# Patient Record
Sex: Female | Born: 1970
Health system: Southern US, Community
[De-identification: ages and names within clinical notes are randomized; demographics above are authoritative.]

## PROBLEM LIST (undated history)

## (undated) DIAGNOSIS — E785 Hyperlipidemia, unspecified: Secondary | ICD-10-CM

## (undated) DIAGNOSIS — E042 Nontoxic multinodular goiter: Secondary | ICD-10-CM

## (undated) DIAGNOSIS — D649 Anemia, unspecified: Secondary | ICD-10-CM

## (undated) DIAGNOSIS — E119 Type 2 diabetes mellitus without complications: Secondary | ICD-10-CM

## (undated) DIAGNOSIS — E669 Obesity, unspecified: Secondary | ICD-10-CM

## (undated) HISTORY — DX: Obesity, unspecified: E66.9

## (undated) HISTORY — DX: Type 2 diabetes mellitus without complications: E11.9

## (undated) HISTORY — PX: ABDOMINAL HYSTERECTOMY: SHX81

## (undated) HISTORY — PX: LEEP: SHX91

## (undated) HISTORY — DX: Hyperlipidemia, unspecified: E78.5

## (undated) HISTORY — DX: Anemia, unspecified: D64.9

---

## 1998-10-04 ENCOUNTER — Other Ambulatory Visit: Admission: RE | Admit: 1998-10-04 | Discharge: 1998-10-04 | Payer: Self-pay

## 1999-02-19 ENCOUNTER — Emergency Department (HOSPITAL_COMMUNITY): Admission: EM | Admit: 1999-02-19 | Discharge: 1999-02-19 | Payer: Self-pay | Admitting: Emergency Medicine

## 2000-03-05 ENCOUNTER — Other Ambulatory Visit: Admission: RE | Admit: 2000-03-05 | Discharge: 2000-03-05 | Payer: Self-pay | Admitting: Obstetrics and Gynecology

## 2001-01-31 ENCOUNTER — Other Ambulatory Visit: Admission: RE | Admit: 2001-01-31 | Discharge: 2001-01-31 | Payer: Self-pay | Admitting: Obstetrics and Gynecology

## 2002-02-12 ENCOUNTER — Encounter (INDEPENDENT_AMBULATORY_CARE_PROVIDER_SITE_OTHER): Payer: Self-pay

## 2002-02-12 ENCOUNTER — Ambulatory Visit (HOSPITAL_COMMUNITY): Admission: RE | Admit: 2002-02-12 | Discharge: 2002-02-12 | Payer: Self-pay | Admitting: Obstetrics and Gynecology

## 2002-08-13 ENCOUNTER — Other Ambulatory Visit: Admission: RE | Admit: 2002-08-13 | Discharge: 2002-08-13 | Payer: Self-pay | Admitting: Obstetrics and Gynecology

## 2004-06-13 ENCOUNTER — Ambulatory Visit (HOSPITAL_COMMUNITY): Admission: RE | Admit: 2004-06-13 | Discharge: 2004-06-13 | Payer: Self-pay | Admitting: Obstetrics and Gynecology

## 2004-12-21 ENCOUNTER — Other Ambulatory Visit: Admission: RE | Admit: 2004-12-21 | Discharge: 2004-12-21 | Payer: Self-pay | Admitting: Obstetrics and Gynecology

## 2005-10-12 ENCOUNTER — Other Ambulatory Visit: Admission: RE | Admit: 2005-10-12 | Discharge: 2005-10-12 | Payer: Self-pay | Admitting: Obstetrics and Gynecology

## 2005-10-17 ENCOUNTER — Encounter: Admission: RE | Admit: 2005-10-17 | Discharge: 2005-10-17 | Payer: Self-pay | Admitting: Obstetrics and Gynecology

## 2005-12-31 ENCOUNTER — Ambulatory Visit (HOSPITAL_COMMUNITY): Admission: RE | Admit: 2005-12-31 | Discharge: 2005-12-31 | Payer: Self-pay | Admitting: Obstetrics and Gynecology

## 2005-12-31 ENCOUNTER — Encounter (INDEPENDENT_AMBULATORY_CARE_PROVIDER_SITE_OTHER): Payer: Self-pay | Admitting: *Deleted

## 2006-08-22 ENCOUNTER — Ambulatory Visit (HOSPITAL_COMMUNITY): Admission: RE | Admit: 2006-08-22 | Discharge: 2006-08-22 | Payer: Self-pay | Admitting: Obstetrics and Gynecology

## 2007-10-16 ENCOUNTER — Ambulatory Visit (HOSPITAL_COMMUNITY): Admission: RE | Admit: 2007-10-16 | Discharge: 2007-10-16 | Payer: Self-pay | Admitting: Obstetrics and Gynecology

## 2007-10-27 ENCOUNTER — Encounter: Admission: RE | Admit: 2007-10-27 | Discharge: 2007-10-27 | Payer: Self-pay | Admitting: Obstetrics and Gynecology

## 2008-04-13 ENCOUNTER — Encounter: Admission: RE | Admit: 2008-04-13 | Discharge: 2008-04-13 | Payer: Self-pay | Admitting: Internal Medicine

## 2008-05-18 ENCOUNTER — Encounter: Admission: RE | Admit: 2008-05-18 | Discharge: 2008-05-18 | Payer: Self-pay | Admitting: Internal Medicine

## 2008-07-31 ENCOUNTER — Emergency Department (HOSPITAL_COMMUNITY): Admission: EM | Admit: 2008-07-31 | Discharge: 2008-07-31 | Payer: Self-pay | Admitting: Emergency Medicine

## 2008-10-29 ENCOUNTER — Encounter: Admission: RE | Admit: 2008-10-29 | Discharge: 2008-10-29 | Payer: Self-pay | Admitting: Internal Medicine

## 2009-03-14 ENCOUNTER — Encounter (INDEPENDENT_AMBULATORY_CARE_PROVIDER_SITE_OTHER): Payer: Self-pay | Admitting: Obstetrics and Gynecology

## 2009-03-14 ENCOUNTER — Ambulatory Visit (HOSPITAL_COMMUNITY): Admission: RE | Admit: 2009-03-14 | Discharge: 2009-03-15 | Payer: Self-pay | Admitting: Obstetrics and Gynecology

## 2009-12-01 ENCOUNTER — Encounter: Admission: RE | Admit: 2009-12-01 | Discharge: 2009-12-01 | Payer: Self-pay | Admitting: Internal Medicine

## 2010-04-30 ENCOUNTER — Encounter: Payer: Self-pay | Admitting: Internal Medicine

## 2010-07-11 LAB — CBC
HCT: 37.6 % (ref 36.0–46.0)
Hemoglobin: 10.8 g/dL — ABNORMAL LOW (ref 12.0–15.0)
MCHC: 33 g/dL (ref 30.0–36.0)
MCV: 87.6 fL (ref 78.0–100.0)
Platelets: 335 10*3/uL (ref 150–400)
RBC: 3.69 MIL/uL — ABNORMAL LOW (ref 3.87–5.11)
RDW: 13.8 % (ref 11.5–15.5)

## 2010-07-11 LAB — BASIC METABOLIC PANEL
CO2: 28 mEq/L (ref 19–32)
Calcium: 8.5 mg/dL (ref 8.4–10.5)
Chloride: 102 mEq/L (ref 96–112)
Creatinine, Ser: 0.73 mg/dL (ref 0.4–1.2)
GFR calc Af Amer: >60 mL/min (ref >60)
Sodium: 134 mEq/L — ABNORMAL LOW (ref 135–145)

## 2010-07-11 LAB — PREGNANCY, URINE: Preg Test, Ur: NEGATIVE

## 2010-08-25 NOTE — H&P (Signed)
   NAME:  Ellen Mills, Ellen Mills                          ACCOUNT NO.:  [id number]   MEDICAL RECORD NO.:  [id number]                   PATIENT TYPE:  AMB   LOCATION:  [facility]                                  FACILITY:  [facility]   PHYSICIAN:  Janine Limbo, M.D.            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  The patient has  been seen at [facility], where she underwent  colposcopically directed biopsies on December 24, 2001, because of a Pap  smear that showed atypical squamous cells of uncertain significance.  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   DRUG ALLERGIES:  None known.   OBSTETRICAL HISTORY:  The patient has had three term vaginal deliveries.  She also had a tubal ligation in the past.   PAST MEDICAL HISTORY:  The patient had a tubal ligation in 1992 as mentioned  above.  Generally speaking she is very healthy.   SOCIAL HISTORY:  The patient denies cigarette use, alcohol use, and  recreational drug use.   REVIEW OF SYSTEMS:  Noncontributory.   FAMILY HISTORY:  The patient's father had diabetes.   PHYSICAL EXAMINATION:  VITAL SIGNS:  Weight is 203 pounds.  HEENT:  Within normal limits.  CHEST:  Clear.  HEART:  Regular rate and rhythm.  BREASTS:  Without masses.  ABDOMEN:  Nontender.  EXTREMITIES:  Within normal limits.  NEUROLOGIC:  Grossly normal.  PELVIC:  External genitalia is normal.  Vagina is normal.  Cervix is normal  except for colposcopic findings.  Uterus is normal size, shape, and  consistency, and adnexa are without masses.    ASSESSMENT:  1. 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  2.  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   PLAN:  The patient will undergo a loop electrosurgical excision procedure.  Risks and benefits were reviewed.                                                  Janine Limbo, M.D.    AVS/MEDQ  D:  02/11/2002  T:  02/11/2002  Job:  [id number]

## 2010-08-25 NOTE — Op Note (Signed)
NAMEEMMABELLE, Ellen Mills                ACCOUNT NO.:  [id number]   MEDICAL RECORD NO.:  [id number]          PATIENT TYPE:  AMB   LOCATION:  SDC                           [facility]ILITY:  [facility]   PHYSICIAN:  Cynthia P. Romine, M.D.DATE OF BIRTH:  [birth year]   DATE OF PROCEDURE:  12/31/2005  DATE OF DISCHARGE:                                 OPERATIVE REPORT   PREOPERATIVE DIAGNOSES:  1. Menorrhagia.  2. Endometrial polyp.   POSTOPERATIVE DIAGNOSES:  Submucous myoma, pathology pending.   OPERATION/PROCEDURE:  Hysteroscopic resection of submucous myoma,   SURGEON:  Cynthia P. Romine, M.D.   ANESTHESIA:  General by LMA.   ESTIMATED BLOOD LOSS:  Minimal.   SORBITOL DEFICIT:  25 mL.   COMPLICATIONS:  None.   DESCRIPTION OF PROCEDURE:  The patient was taken to the operating room and  after the induction of adequate general anesthesia by LMA, was placed in the  dorsal supine position and prepped and draped in the usual fashion.  The  bladder was drained with the red rubber catheter.  A posterior weighted  __________  retractor placed.  The cervix was grasped on its anterior lip  with the single-tooth tenaculum.  Cervix was dilated to a #25 Shawnie Pons.  At  that point,  __________  was inserted with dilator at the single-tooth  tenaculum  fell of the anterior lip.  Therefore, it was replaced and a  second tenaculum was put on the posterior lip and the dilation continued to  a #31 Pratt.  Prior to dilation, the uterus was then sounded to 8 cm.  The  operative hysteroscope was introduced.  Sorbitol was used as a distention  medium.  The endometrial lesion was visualized and occupied most of the  endometrial cavity.  On sonohystogram, it had measured 2 x 1.8 x 1.1 cm and  appearance hysteroscopically was consistent with that measurement.  The  single loupe was used to resect the myoma in pieces. There was very little  bleeding from the myoma. The patient had been given preoperative Lupron on  November 26, 2005.  Following completion of the removal of the myoma, several  thin strips of endometrium were also removed in an effort to curb her  menstrual bleeding postoperatively.  Specimens were sent for pathology.  Photographs for documentation were taken after removed of the polyp.  The  scope was removed.  Instruments removed from the vagina.  Procedure was  terminated.  The patient tolerated it well and was in satisfactory condition  to post anesthesia recovery.          ______________________________  Edwena Felty. Romine, M.D.    CPR/MEDQ  D:  12/31/2005  T:  01/01/2006  Job:  [id number]

## 2010-08-25 NOTE — Op Note (Signed)
NAME:  Ellen Mills, Ellen Mills                          ACCOUNT NO.:  [id number]   MEDICAL RECORD NO.:  [id number]                   PATIENT TYPE:  AMB   LOCATION:  SDC                                  [facility]:  [facility]   PHYSICIAN:  Janine Limbo, M.D.            DATE OF BIRTH:  July 02, 1970   DATE OF PROCEDURE:  02/12/2002  DATE OF DISCHARGE:                                 OPERATIVE REPORT   PREOPERATIVE DIAGNOSES:  1. 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  2. 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   POSTOPERATIVE DIAGNOSES:  1. 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  2. 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   PROCEDURE:  Loop electrosurgical excision procedure.   SURGEON:  Janine Limbo, M.D.   ANESTHESIA:  Local.   DISPOSITION:  The patient is a 40 year old female para 3-0-0-3 who presents  with a Pap smear that showed atypical squamous cells of undetermined  significance.  Colposcopically directed biopsy showed a high grade squamous  intraepithelial lesion.  The patient understands the indications for her  procedure and she accepts the associated risks.   FINDINGS:  There was a small area of nonstaining around the endocervical os.   PROCEDURE:  The patient was taken to the operating room where she was placed  in a lithotomy position.  The vagina was prepped with multiple layers of  Betadine.  The cervix was injected with 30 cc of a diluted solution of 0.5%  Marcaine, Pitressin, and saline.  Lugol solution was placed on the cervix  and a small area of nonstaining cells were noted around the os.  The loop of  the electrosurgical machine was then used to outline the lesion and then a  cone shaped specimen was obtained.  Hemostasis was adequate.  The base of  the defect was then cauterized using the ball apparatus as was the periphery  of the conization site.  The patient tolerated her procedure well.  The  estimated blood loss was 2 cc.  The  patient was returned to the supine  position and then taken to the recovery room in stable condition.   FOLLOWUP INSTRUCTIONS:  The patient will use ibuprofen 600 mg q.6h. as  needed for pain.  She will return in two or three weeks to see Dr. Stefano Gaul.  She was given a copy of the postoperative instruction sheet as prepared by  the [facility] for patients who have undergone a  dilatation and curettage, but that was then modified for a LEEP procedure.                                               Janine Limbo, M.D.    AVS/MEDQ  D:  02/12/2002  T:  02/12/2002  Job:  [id number]

## 2010-10-24 ENCOUNTER — Other Ambulatory Visit: Payer: Self-pay | Admitting: Internal Medicine

## 2010-10-24 DIAGNOSIS — Z1231 Encounter for screening mammogram for malignant neoplasm of breast: Secondary | ICD-10-CM

## 2010-12-04 ENCOUNTER — Ambulatory Visit: Payer: Self-pay

## 2011-01-03 ENCOUNTER — Ambulatory Visit
Admission: RE | Admit: 2011-01-03 | Discharge: 2011-01-03 | Disposition: A | Discharge status: Home / Self Care | Payer: 59 | Source: Ambulatory Visit | Attending: Internal Medicine | Admitting: Internal Medicine

## 2011-01-03 DIAGNOSIS — Z1231 Encounter for screening mammogram for malignant neoplasm of breast: Secondary | ICD-10-CM

## 2011-12-07 ENCOUNTER — Other Ambulatory Visit: Payer: Self-pay | Admitting: Obstetrics and Gynecology

## 2011-12-07 DIAGNOSIS — Z1231 Encounter for screening mammogram for malignant neoplasm of breast: Secondary | ICD-10-CM

## 2012-01-04 ENCOUNTER — Ambulatory Visit
Admission: RE | Admit: 2012-01-04 | Discharge: 2012-01-04 | Disposition: A | Discharge status: Home / Self Care | Payer: 59 | Source: Ambulatory Visit | Attending: Obstetrics and Gynecology | Admitting: Obstetrics and Gynecology

## 2012-01-04 DIAGNOSIS — Z1231 Encounter for screening mammogram for malignant neoplasm of breast: Secondary | ICD-10-CM

## 2012-10-20 ENCOUNTER — Other Ambulatory Visit: Payer: Self-pay

## 2012-10-20 DIAGNOSIS — Z1231 Encounter for screening mammogram for malignant neoplasm of breast: Secondary | ICD-10-CM

## 2013-01-30 ENCOUNTER — Ambulatory Visit
Admission: RE | Admit: 2013-01-30 | Discharge: 2013-01-30 | Disposition: A | Discharge status: Home / Self Care | Payer: 59 | Source: Ambulatory Visit

## 2013-01-30 DIAGNOSIS — Z1231 Encounter for screening mammogram for malignant neoplasm of breast: Secondary | ICD-10-CM

## 2013-03-14 ENCOUNTER — Encounter: Payer: 59 | Attending: Obstetrics and Gynecology

## 2013-03-14 NOTE — Progress Notes (Deleted)
Patient was seen on 03/14/13 for the complete diabetes self-management series at [facility].  Current A1c = *** on ***  Handouts given during class include:  Living Well with Diabetes book  Carb Counting and Meal Planning book  Meal Plan Card  Carbohydrate guide  Meal planning worksheet  Low Sodium Flavoring Tips  The diabetes portion plate  Low Carbohydrate Snack Suggestions  A1c to eAG Conversion Chart  Diabetes Medications  Stress Management  Diabetes Recommended Care Schedule  Diabetes Success Plan  Core Class Satisfaction Survey  The following learning objectives were met by the patient during this course:  Describe diabetes  State some common risk factors for diabetes  Defines the role of glucose and insulin  Identifies type of diabetes and pathophysiology  Describe the relationship between diabetes and cardiovascular risk  State the members of the Healthcare Team  States the rationale for glucose monitoring  State when to test glucose  State their individual Target Range  State the importance of logging glucose readings  Describe how to interpret glucose readings  Identifies A1C target  Explain the correlation between A1c and eAG values  State symptoms and treatment of high blood glucose  State symptoms and treatment of low blood glucose  Explain proper technique for glucose testing  Identifies proper sharps disposal  Describe the role of different macronutrients on glucose  Explain how carbohydrates affect blood glucose  State what foods contain the most carbohydrates  Demonstrate carbohydrate counting  Demonstrate how to read Nutrition Facts food label  Describe effects of various fats on heart health  Describe the importance of good nutrition for health and healthy eating strategies  Describe techniques for managing your shopping, cooking and meal planning  List strategies to follow meal  plan when dining out  Describe the effects of alcohol on glucose and how to use it safely   State the amount of activity recommended for healthy living   Describe activities suitable for individual needs   Identify ways to regularly incorporate activity into daily life   Identify barriers to activity and ways to over come these barriers  Identify diabetes medications being personally used and their primary action for lowering glucose and possible side effects   Describe role of stress on blood glucose and develop strategies to address psychosocial issues   Identify diabetes complications and ways to prevent them  Explain how to manage diabetes during illness   Evaluate success in meeting personal goal   Establish 2-3 goals that they will plan to diligently work on until they return for the  45-month follow-up visit  Goals:  Follow Diabetes Meal Plan as instructed  Eat 3 meals and 2 snacks, every 3-5 hrs  Limit carbohydrate intake to 45 grams carbohydrate/meal Limit carbohydrate intake to 15 grams carbohydrate/snack Add lean protein foods to meals/snacks  Monitor glucose levels as instructed by your doctor  Aim for 15-30 mins of physical activity daily as tolerated  Bring food record and glucose log to your next nutrition visit  Your patient has established the following 4 month goals in their individualized success plan:  ***  ***  ***  Your patient has identified these potential barriers to change:  ***  ***  ***  Your patient has identified their diabetes self-care support plan as  ***  ***  ***

## 2014-01-27 ENCOUNTER — Other Ambulatory Visit: Payer: Self-pay

## 2014-01-27 DIAGNOSIS — Z1239 Encounter for other screening for malignant neoplasm of breast: Secondary | ICD-10-CM

## 2014-03-09 ENCOUNTER — Ambulatory Visit: Payer: 59

## 2014-09-13 ENCOUNTER — Other Ambulatory Visit: Payer: Self-pay

## 2014-09-13 DIAGNOSIS — Z1231 Encounter for screening mammogram for malignant neoplasm of breast: Secondary | ICD-10-CM

## 2014-09-17 ENCOUNTER — Ambulatory Visit
Admission: RE | Admit: 2014-09-17 | Discharge: 2014-09-17 | Disposition: A | Discharge status: Home / Self Care | Payer: 59 | Source: Ambulatory Visit

## 2014-09-17 DIAGNOSIS — Z1231 Encounter for screening mammogram for malignant neoplasm of breast: Secondary | ICD-10-CM

## 2015-06-07 DIAGNOSIS — H52203 Unspecified astigmatism, bilateral: Secondary | ICD-10-CM | POA: Diagnosis not present

## 2015-06-07 DIAGNOSIS — H5203 Hypermetropia, bilateral: Secondary | ICD-10-CM | POA: Diagnosis not present

## 2015-06-07 DIAGNOSIS — H524 Presbyopia: Secondary | ICD-10-CM | POA: Diagnosis not present

## 2016-02-06 ENCOUNTER — Encounter (HOSPITAL_COMMUNITY): Payer: Self-pay | Admitting: Emergency Medicine

## 2016-02-06 ENCOUNTER — Ambulatory Visit (INDEPENDENT_AMBULATORY_CARE_PROVIDER_SITE_OTHER): Payer: 59

## 2016-02-06 ENCOUNTER — Ambulatory Visit (HOSPITAL_COMMUNITY)
Admission: EM | Admit: 2016-02-06 | Discharge: 2016-02-06 | Disposition: A | Discharge status: Home / Self Care | Payer: 59 | Attending: Emergency Medicine | Admitting: Emergency Medicine

## 2016-02-06 DIAGNOSIS — M1711 Unilateral primary osteoarthritis, right knee: Secondary | ICD-10-CM | POA: Diagnosis not present

## 2016-02-06 DIAGNOSIS — M25561 Pain in right knee: Secondary | ICD-10-CM | POA: Diagnosis not present

## 2016-02-06 DIAGNOSIS — M7989 Other specified soft tissue disorders: Secondary | ICD-10-CM | POA: Diagnosis not present

## 2016-02-06 MED ORDER — INDOMETHACIN ER 75 MG PO CPCR: 75.0000 mg | ORAL_CAPSULE | Freq: Two times a day (BID) | ORAL | 1 refills | Status: DC

## 2016-02-06 MED FILL — INDOMETHACIN ER 75 MG CAP: 75 | 15 days supply | Qty: 30 | Fill #0

## 2016-02-06 NOTE — ED Provider Notes (Signed)
CSN: [id number]     Arrival date & time 02/06/16  1542 History   None    Chief Complaint  Patient presents with  . Knee Pain   (Consider location/radiation/quality/duration/timing/severity/associated sxs/prior Treatment) Patient has developed severe right knee pain that she has noticed since getting out of bed this am.  She denies any injury.     The history is provided by the patient.  Knee Pain  Location:  Knee Time since incident:  1 day Injury: no   Knee location:  R knee Pain details:    Quality:  Aching   Radiates to:  R leg   Severity:  Moderate   Onset quality:  Sudden   Duration:  1 day   Timing:  Constant   Progression:  Worsening Chronicity:  New Dislocation: no   Foreign body present:  No foreign bodies Tetanus status:  Unknown Prior injury to area:  No Relieved by:  Nothing Worsened by:  Nothing Ineffective treatments:  None tried   History reviewed. No pertinent past medical history. History reviewed. No pertinent surgical history. History reviewed. No pertinent family history. Social History  Substance Use Topics  . Smoking status: Never Smoker  . Smokeless tobacco: Current User  . Alcohol use No   OB History    No data available     Review of Systems  Constitutional: Negative.   HENT: Negative.   Eyes: Negative.   Respiratory: Negative.   Cardiovascular: Negative.   Gastrointestinal: Negative.   Endocrine: Negative.   Genitourinary: Negative.   Musculoskeletal: Positive for arthralgias.  Skin: Negative.   Allergic/Immunologic: Negative.   Neurological: Negative.   Hematological: Negative.   Psychiatric/Behavioral: Negative.     Allergies  Review of patient's allergies indicates no known allergies.  Home Medications   Prior to Admission medications   Not on File   Meds Ordered and Administered this Visit  Medications - No data to display  BP (!) 113/46 (BP Location: Left Arm) Comment: notified cma  Pulse 95   Temp 98.7 F  (37.1 C) (Oral)   Resp 16   SpO2 99%  No data found.   Physical Exam  Constitutional: She appears well-developed and well-nourished.  HENT:  Head: Normocephalic and atraumatic.  Right Ear: External ear normal.  Left Ear: External ear normal.  Mouth/Throat: Oropharynx is clear and moist.  Eyes: EOM are normal. Pupils are equal, round, and reactive to light.  Neck: Normal range of motion. Neck supple.  Cardiovascular: Normal rate, regular rhythm and normal heart sounds.   Pulmonary/Chest: Effort normal and breath sounds normal.  Abdominal: Soft. Bowel sounds are normal.  Musculoskeletal: She exhibits tenderness.  Right knee swollen and tender with decreased ROM  Nursing note and vitals reviewed.   Urgent Care Course   Clinical Course    Procedures (including critical care time)  Labs Review Labs Reviewed - No data to display  Imaging Review Dg Knee Complete 4 Views Right  Result Date: 02/06/2016 CLINICAL DATA:  Pain and swelling starting Sunday, no known injury EXAM: RIGHT KNEE - COMPLETE 4+ VIEW COMPARISON:  None. FINDINGS: Four views of the right knee submitted. No acute fracture or subluxation. Mild narrowing of medial joint compartment. Minimal spurring of medial tibial plateau. Small calcification of medial collateral ligament. Moderate joint effusion. Minimal spurring of patella. IMPRESSION: No acute fracture or subluxation. Moderate joint effusion. Osteoarthritic changes. Electronically Signed   By: Natasha MeadLiviu  Pop M.D.   On: 02/06/2016 16:38     Visual Acuity  Review  Right Eye Distance:   Left Eye Distance:   Bilateral Distance:    Right Eye Near:   Left Eye Near:    Bilateral Near:         MDM  Right knee pain Right knee osteoarthritis  Advised patient to follow up with PCP if not better. Indomethacin ER 75mg  one po bid prn knee pain #30      Deatra CanterWilliam J Dyani Babel, FNP 02/06/16 1710

## 2016-02-06 NOTE — ED Triage Notes (Signed)
[facility], Patient a states she noticed it was sore last night, it was hard to bend. This morning it was hard to walk on and swollen.

## 2016-07-25 ENCOUNTER — Other Ambulatory Visit: Payer: Self-pay | Admitting: Obstetrics and Gynecology

## 2016-07-25 DIAGNOSIS — Z1231 Encounter for screening mammogram for malignant neoplasm of breast: Secondary | ICD-10-CM

## 2016-07-26 ENCOUNTER — Ambulatory Visit
Admission: RE | Admit: 2016-07-26 | Discharge: 2016-07-26 | Disposition: A | Discharge status: Home / Self Care | Payer: 59 | Source: Ambulatory Visit | Attending: Obstetrics and Gynecology | Admitting: Obstetrics and Gynecology

## 2016-07-26 DIAGNOSIS — Z1231 Encounter for screening mammogram for malignant neoplasm of breast: Secondary | ICD-10-CM | POA: Diagnosis not present

## 2016-09-11 DIAGNOSIS — H524 Presbyopia: Secondary | ICD-10-CM | POA: Diagnosis not present

## 2016-09-11 DIAGNOSIS — H5203 Hypermetropia, bilateral: Secondary | ICD-10-CM | POA: Diagnosis not present

## 2016-09-11 DIAGNOSIS — H52203 Unspecified astigmatism, bilateral: Secondary | ICD-10-CM | POA: Diagnosis not present

## 2017-05-12 ENCOUNTER — Telehealth (HOSPITAL_COMMUNITY): Payer: Self-pay | Admitting: Emergency Medicine

## 2017-05-12 ENCOUNTER — Ambulatory Visit (HOSPITAL_COMMUNITY)
Admission: EM | Admit: 2017-05-12 | Discharge: 2017-05-12 | Disposition: A | Discharge status: Home / Self Care | Payer: 59

## 2017-05-12 ENCOUNTER — Other Ambulatory Visit: Payer: Self-pay

## 2017-05-12 ENCOUNTER — Encounter (HOSPITAL_COMMUNITY): Payer: Self-pay | Admitting: *Deleted

## 2017-05-12 DIAGNOSIS — R05 Cough: Secondary | ICD-10-CM

## 2017-05-12 DIAGNOSIS — R059 Cough, unspecified: Secondary | ICD-10-CM

## 2017-05-12 DIAGNOSIS — J3489 Other specified disorders of nose and nasal sinuses: Secondary | ICD-10-CM | POA: Diagnosis not present

## 2017-05-12 DIAGNOSIS — R6889 Other general symptoms and signs: Secondary | ICD-10-CM

## 2017-05-12 DIAGNOSIS — R52 Pain, unspecified: Secondary | ICD-10-CM | POA: Diagnosis not present

## 2017-05-12 DIAGNOSIS — R69 Illness, unspecified: Secondary | ICD-10-CM

## 2017-05-12 DIAGNOSIS — J111 Influenza due to unidentified influenza virus with other respiratory manifestations: Secondary | ICD-10-CM

## 2017-05-12 MED ORDER — ACETAMINOPHEN 325 MG PO TABS
ORAL_TABLET | ORAL | Status: AC
  Filled 2017-05-12: qty 2

## 2017-05-12 MED ORDER — PSEUDOEPHEDRINE HCL 15 MG/5ML PO LIQD: 60.0000 mg | Freq: Three times a day (TID) | ORAL | 0 refills | Status: DC | PRN

## 2017-05-12 MED ORDER — HYDROCODONE-HOMATROPINE 5-1.5 MG/5ML PO SYRP: 5.0000 mL | ORAL_SOLUTION | Freq: Every evening | ORAL | 0 refills | Status: DC | PRN

## 2017-05-12 MED ORDER — ACETAMINOPHEN 325 MG PO TABS
650.0000 mg | ORAL_TABLET | Freq: Once | ORAL | Status: AC
  Administered 2017-05-12: 650 mg via ORAL

## 2017-05-12 NOTE — Discharge Instructions (Signed)
Drink 2 liters of water daily. For sore throat try using a honey-based tea. Use 3 teaspoons of honey with juice squeezed from half lemon. Place shaved pieces of ginger into 1/2-1 cup of water and warm over stove top. Then mix the ingredients and repeat every 4 hours as needed. You may take 500mg  Tylenol with ibuprofen 400-600mg  every 6 hours for fever, body aches, pain and inflammation.

## 2017-05-12 NOTE — ED Provider Notes (Signed)
  MRN: [id number] DOB: 03/28/1971  Subjective:   Ellen Mills is a 47 y.o. female presenting for 1 day history of body aches, fever (101F), scratchy throat, mildly productive cough, wheezing, sinus pressure. Denies chest pain, n/v, abdominal pain, rashes, sinus pain, ear pain. Has tried otc cough medications, ASA, DayQuil with minimal relief. Denies history of asthma. Denies smoking cigarettes.   [name] takes multivitamins and has No Known Allergies.  [name] denies chronic medical conditions and  has a past surgical history that includes Abdominal hysterectomy.  Objective:   Vitals: Pulse (!) 139   Temp (!) 101.5 F (38.6 C) (Oral)   Resp (!) 22   SpO2 97%   Physical Exam  Constitutional: She is oriented to person, place, and time. She appears well-developed and well-nourished.  HENT:  Mouth/Throat: Oropharynx is clear and moist.  Eyes: Right eye exhibits no discharge. Left eye exhibits no discharge.  Neck: Normal range of motion. Neck supple.  Cardiovascular: Normal rate, regular rhythm and intact distal pulses. Exam reveals no gallop and no friction rub.  No murmur heard. Pulmonary/Chest: No respiratory distress. She has no wheezes. She has no rales.  Lymphadenopathy:    She has no cervical adenopathy.  Neurological: She is alert and oriented to person, place, and time.  Skin: Skin is warm and dry.  Psychiatric: She has a normal mood and affect.   Assessment and Plan :   Flu-like symptoms  Influenza-like illness  Cough  Body aches  Sinus pressure  Will manage empirically for influenza. Return-to-clinic precautions discussed, patient verbalized understanding.    Wallis BambergMani, Alizeh Madril, PA-C 05/12/17 1315

## 2017-05-12 NOTE — Telephone Encounter (Signed)
Pt called .... sts her pharmacy does not carry pseudoephedrine HCl  Called [facility] to confirm... Pharmacists did say that [facility] is not carrying medication at the moment  Resent Rx to [facility] [address] per Pt's request.

## 2017-05-12 NOTE — ED Triage Notes (Signed)
Reports waking up yesterday @ 0400 with wheezing, cough, fever up to 101.  Has been taking OTC cold meds without relief.  Took ASA and Dayquil yesterday; no meds today.

## 2017-07-02 ENCOUNTER — Other Ambulatory Visit: Payer: Self-pay | Admitting: Obstetrics and Gynecology

## 2017-07-02 DIAGNOSIS — Z139 Encounter for screening, unspecified: Secondary | ICD-10-CM

## 2017-07-08 DIAGNOSIS — E119 Type 2 diabetes mellitus without complications: Secondary | ICD-10-CM

## 2017-07-08 HISTORY — DX: Type 2 diabetes mellitus without complications: E11.9

## 2017-07-29 ENCOUNTER — Ambulatory Visit
Admission: RE | Admit: 2017-07-29 | Discharge: 2017-07-29 | Disposition: A | Discharge status: Home / Self Care | Payer: 59 | Source: Ambulatory Visit | Attending: Obstetrics and Gynecology | Admitting: Obstetrics and Gynecology

## 2017-07-29 ENCOUNTER — Encounter: Payer: Self-pay | Admitting: Family Medicine

## 2017-07-29 ENCOUNTER — Ambulatory Visit (INDEPENDENT_AMBULATORY_CARE_PROVIDER_SITE_OTHER): Payer: 59 | Admitting: Family Medicine

## 2017-07-29 VITALS — BP 122/90 | HR 82 | Temp 98.1°F | Ht 66.0 in | Wt 249.4 lb

## 2017-07-29 DIAGNOSIS — Z1231 Encounter for screening mammogram for malignant neoplasm of breast: Secondary | ICD-10-CM | POA: Diagnosis not present

## 2017-07-29 DIAGNOSIS — Z23 Encounter for immunization: Secondary | ICD-10-CM | POA: Diagnosis not present

## 2017-07-29 DIAGNOSIS — Z0000 Encounter for general adult medical examination without abnormal findings: Secondary | ICD-10-CM | POA: Diagnosis not present

## 2017-07-29 DIAGNOSIS — Z139 Encounter for screening, unspecified: Secondary | ICD-10-CM

## 2017-07-29 NOTE — Patient Instructions (Addendum)
Consider a thumb spica splint for both hands.  Aim to do some physical exertion for 150 minutes per week. This is typically divided into 5 days per week, 30 minutes per day. The activity should be enough to get your heart rate up. Anything is better than nothing if you have time constraints. Consider lifting weights.  Healthy Eating Plan Many factors influence your heart health, including eating and exercise habits. Heart (coronary) risk increases with abnormal blood fat (lipid) levels. Heart-healthy meal planning includes limiting unhealthy fats, increasing healthy fats, and making other small dietary changes. This includes maintaining a healthy body weight to help keep lipid levels within a normal range.  WHAT IS MY PLAN?  Your health care provider recommends that you:  Drink a glass of water before meals to help with satiety.  Eat slowly.  An alternative to the water is to add Metamucil. This will help with satiety as well. It does contain calories, unlike water.  WHAT TYPES OF FAT SHOULD I CHOOSE?  Choose healthy fats more often. Choose monounsaturated and polyunsaturated fats, such as olive oil and canola oil, flaxseeds, walnuts, almonds, and seeds.  Eat more omega-3 fats. Good choices include salmon, mackerel, sardines, tuna, flaxseed oil, and ground flaxseeds. Aim to eat fish at least two times each week.  Avoid foods with partially hydrogenated oils in them. These contain trans fats. Examples of foods that contain trans fats are stick margarine, some tub margarines, cookies, crackers, and other baked goods. If you are going to avoid a fat, this is the one to avoid!  WHAT GENERAL GUIDELINES DO I NEED TO FOLLOW?  Check food labels carefully to identify foods with trans fats. Avoid these types of options when possible.  Fill one half of your plate with vegetables and green salads. Eat 4-5 servings of vegetables per day. A serving of vegetables equals 1 cup of raw leafy vegetables,   cup of raw or cooked cut-up vegetables, or  cup of vegetable juice.  Fill one fourth of your plate with whole grains. Look for the word "whole" as the first word in the ingredient list.  Fill one fourth of your plate with lean protein foods.  Eat 4-5 servings of fruit per day. A serving of fruit equals one medium whole fruit,  cup of dried fruit,  cup of fresh, frozen, or canned fruit. Try to avoid fruits in cups/syrups as the sugar content can be high.  Eat more foods that contain soluble fiber. Examples of foods that contain this type of fiber are apples, broccoli, carrots, beans, peas, and barley. Aim to get 20-30 g of fiber per day.  Eat more home-cooked food and less restaurant, buffet, and fast food.  Limit or avoid alcohol.  Limit foods that are high in starch and sugar.  Avoid fried foods when able.  Cook foods by using methods other than frying. Baking, boiling, grilling, and broiling are all great options. Other fat-reducing suggestions include: ? Removing the skin from poultry. ? Removing all visible fats from meats. ? Skimming the fat off of stews, soups, and gravies before serving them. ? Steaming vegetables in water or broth.  Lose weight if you are overweight. Losing just 5-10% of your initial body weight can help your overall health and prevent diseases such as diabetes and heart disease.  Increase your consumption of nuts, legumes, and seeds to 4-5 servings per week. One serving of dried beans or legumes equals  cup after being cooked, one serving of nuts  equals 1 ounces, and one serving of seeds equals  ounce or 1 tablespoon.  WHAT ARE GOOD FOODS CAN I EAT? Grains Grainy breads (try to find bread that is 3 g of fiber per slice or greater), oatmeal, light popcorn. Whole-grain cereals. Rice and pasta, including brown rice and those that are made with whole wheat. Edamame pasta is a great alternative to grain pasta. It has a higher protein content. Try to avoid  significant consumption of white bread, sugary cereals, or pastries/baked goods.  Vegetables All vegetables. Cooked white potatoes do not count as vegetables.  Fruits All fruits, but limit pineapple and bananas as these fruits have a higher sugar content.  Meats and Other Protein Sources Lean, well-trimmed beef, veal, pork, and lamb. Chicken and [place] without skin. All fish and shellfish. Wild duck, rabbit, pheasant, and venison. Egg whites or low-cholesterol egg substitutes. Dried beans, peas, lentils, and tofu.Seeds and most nuts.  Dairy Low-fat or nonfat cheeses, including ricotta, string, and mozzarella. Skim or 1% milk that is liquid, powdered, or evaporated. Buttermilk that is made with low-fat milk. Nonfat or low-fat yogurt. Soy/Almond milk are good alternatives if you cannot handle dairy.  Beverages Water is the best for you. Sports drinks with less sugar are more desirable unless you are a highly active athlete.  Sweets and Desserts Sherbets and fruit ices. Honey, jam, marmalade, jelly, and syrups. Dark chocolate.  Eat all sweets and desserts in moderation.  Fats and Oils Nonhydrogenated (trans-free) margarines. Vegetable oils, including soybean, sesame, sunflower, olive, peanut, safflower, corn, canola, and cottonseed. Salad dressings or mayonnaise that are made with a vegetable oil. Limit added fats and oils that you use for cooking, baking, salads, and as spreads.  Other Cocoa powder. Coffee and tea. Most condiments.  The items listed above may not be a complete list of recommended foods or beverages. Contact your dietitian for more options.

## 2017-07-29 NOTE — Progress Notes (Signed)
Chief Complaint  Patient presents with  . Establish Care     Well Woman Ellen Mills is here for a complete physical.   Her last physical was >1 year ago.  Current diet: in general, an "unhealthy" diet. Current exercise: Physically active at work. Weight is stable and she sometimes will have daytime fatigue. No LMP recorded. Patient has had a hysterectomy. Seatbelt? Yes  Health Maintenance Mammogram- This AM Tetanus- No HIV screening- Yes 07/29/12  Past Medical History:  Diagnosis Date  . Anemia   . Hyperlipidemia     Past Surgical History:  Procedure Laterality Date  . ABDOMINAL HYSTERECTOMY     Medications  Current Outpatient Medications on File Prior to Visit  Medication Sig Dispense Refill  . aspirin EC 81 MG tablet Take 81 mg by mouth daily.    . MULTIPLE VITAMINS PO Take by mouth.     Allergies No Known Allergies  Review of Systems: Constitutional:  no fevers Eye:  no recent significant change in vision Ear/Nose/Mouth/Throat:  Ears:  no tinnitus or vertigo and no recent change in hearing, Nose/Mouth/Throat:  no complaints of nasal congestion, no sore throat Cardiovascular: no current chest pain Respiratory: no shortness of breath Gastrointestinal: no change in bowel habits,  GU:  Female: negative for dysuria Musculoskeletal/Extremities: +b/l thumb pain; otherwise no pain of the joints Integumentary (Skin/Breast):  no abnormal skin lesions reported Neurologic:  no headaches Endocrine:  denies fatigue Hematologic/Lymphatic:  no unexpected weight changes  Exam BP 122/90 (BP Location: Left Arm, Patient Position: Sitting, Cuff Size: Large)   Pulse 82   Temp 98.1 F (36.7 C) (Oral)   Ht 5\' 6"  (1.676 m)   Wt 249 lb 6 oz (113.1 kg)   SpO2 98%   BMI 40.25 kg/m  General:  well developed, well nourished, in no apparent distress Skin:  no significant moles, warts, or growths Head:  no masses, lesions, or tenderness Eyes:  pupils equal and round, sclera  anicteric without injection Ears:  canals without lesions, TMs shiny without retraction, no obvious effusion, no erythema Nose:  nares patent, septum midline, mucosa normal, and no drainage or sinus tenderness Throat/Pharynx:  lips and gingiva without lesion; tongue and uvula midline; non-inflamed pharynx; no exudates or postnasal drainage Neck: neck supple without adenopathy, thyromegaly, or masses Breasts:  Not done Thorax:  nontender Lungs:  clear to auscultation, breath sounds equal bilaterally, no respiratory distress Cardio:  regular rate and rhythm without murmurs, heart sounds without clicks or rubs, point of maximal impulse normal; no lifts, heaves, or thrills Abdomen:  abdomen soft, nontender; bowel sounds normal; no masses or organomegaly Genital: Defer to GYN Musculoskeletal:  symmetrical muscle groups noted without atrophy or deformity Extremities:  no clubbing, cyanosis, or edema, no deformities, no skin discoloration Neuro:  gait normal; deep tendon reflexes normal and symmetric Psych: well oriented with normal range of affect and appropriate judgment/insight  Assessment and Plan  Well adult exam - Plan: Hemoglobin A1c, Lipid panel, CBC, Comprehensive metabolic panel  Need for diphtheria-tetanus-pertussis (Tdap) vaccine - Plan: Tdap vaccine greater than or equal to 7yo IM   Well 47 y.o. female. Counseled on diet and exercise. Healthy diet handout given. Other orders as above. Follow up in 1 yr pending above. The patient voiced understanding and agreement to the plan.  Ellen Rocheicholas Paul FostoriaWendling, DO 07/29/17 11:44 AM

## 2017-07-29 NOTE — Progress Notes (Signed)
Pre visit review using our clinic review tool, if applicable. No additional management support is needed unless otherwise documented below in the visit note. 

## 2017-07-30 ENCOUNTER — Other Ambulatory Visit: Payer: 59 | Admitting: *Deleted

## 2017-07-30 ENCOUNTER — Other Ambulatory Visit: Payer: Self-pay | Admitting: *Deleted

## 2017-07-30 DIAGNOSIS — Z0000 Encounter for general adult medical examination without abnormal findings: Secondary | ICD-10-CM

## 2017-07-31 ENCOUNTER — Encounter: Payer: Self-pay | Admitting: Family Medicine

## 2017-07-31 LAB — COMPREHENSIVE METABOLIC PANEL
ALBUMIN: 4.1 g/dL (ref 3.5–5.5)
ALK PHOS: 64 IU/L (ref 39–117)
ALT: 10 IU/L (ref 0–32)
AST: 6 IU/L (ref 0–40)
Albumin/Globulin Ratio: 1.2 (ref 1.2–2.2)
BUN / CREAT RATIO: 11 (ref 9–23)
BUN: 10 mg/dL (ref 6–24)
Bilirubin Total: 0.4 mg/dL (ref 0.0–1.2)
CO2: 26 mmol/L (ref 20–29)
CREATININE: 0.94 mg/dL (ref 0.57–1.00)
Calcium: 9.4 mg/dL (ref 8.7–10.2)
Chloride: 102 mmol/L (ref 96–106)
GFR calc Af Amer: 84 mL/min/{1.73_m2} (ref >59)
GFR, EST NON AFRICAN AMERICAN: 72 mL/min/{1.73_m2} (ref >59)
GLOBULIN, TOTAL: 3.3 g/dL (ref 1.5–4.5)
Glucose: 132 mg/dL — ABNORMAL HIGH (ref 65–99)
Potassium: 4.5 mmol/L (ref 3.5–5.2)
SODIUM: 140 mmol/L (ref 134–144)
TOTAL PROTEIN: 7.4 g/dL (ref 6.0–8.5)

## 2017-07-31 LAB — HEMOGLOBIN A1C
Est. average glucose Bld gHb Est-mCnc: 154 mg/dL
HEMOGLOBIN A1C: 7 % — AB (ref 4.8–5.6)

## 2017-07-31 LAB — LIPID PANEL
CHOLESTEROL TOTAL: 212 mg/dL — AB (ref 100–199)
Chol/HDL Ratio: 6.6 ratio — ABNORMAL HIGH (ref 0.0–4.4)
HDL: 32 mg/dL — ABNORMAL LOW (ref >39)
LDL Calculated: 152 mg/dL — ABNORMAL HIGH (ref 0–99)
Triglycerides: 141 mg/dL (ref 0–149)
VLDL CHOLESTEROL CAL: 28 mg/dL (ref 5–40)

## 2017-07-31 LAB — CBC
HEMATOCRIT: 38.4 % (ref 34.0–46.6)
Hemoglobin: 12.5 g/dL (ref 11.1–15.9)
MCH: 28.3 pg (ref 26.6–33.0)
MCHC: 32.6 g/dL (ref 31.5–35.7)
MCV: 87 fL (ref 79–97)
Platelets: 397 10*3/uL — ABNORMAL HIGH (ref 150–379)
RBC: 4.42 x10E6/uL (ref 3.77–5.28)
RDW: 13.6 % (ref 12.3–15.4)
WBC: 7.8 10*3/uL (ref 3.4–10.8)

## 2017-08-05 ENCOUNTER — Encounter: Payer: Self-pay | Admitting: Family Medicine

## 2017-08-05 ENCOUNTER — Ambulatory Visit (INDEPENDENT_AMBULATORY_CARE_PROVIDER_SITE_OTHER): Payer: 59 | Admitting: Family Medicine

## 2017-08-05 VITALS — BP 118/82 | HR 96 | Temp 98.5°F | Ht 66.0 in | Wt 247.4 lb

## 2017-08-05 DIAGNOSIS — E119 Type 2 diabetes mellitus without complications: Secondary | ICD-10-CM | POA: Diagnosis not present

## 2017-08-05 DIAGNOSIS — E785 Hyperlipidemia, unspecified: Secondary | ICD-10-CM

## 2017-08-05 DIAGNOSIS — B353 Tinea pedis: Secondary | ICD-10-CM

## 2017-08-05 DIAGNOSIS — E1165 Type 2 diabetes mellitus with hyperglycemia: Secondary | ICD-10-CM | POA: Insufficient documentation

## 2017-08-05 LAB — MICROALBUMIN / CREATININE URINE RATIO
Creatinine,U: 148.5 mg/dL
Microalb Creat Ratio: 0.5 mg/g (ref 0.0–30.0)
Microalb, Ur: <0.7 mg/dL (ref 0.0–1.9)

## 2017-08-05 MED ORDER — KETOCONAZOLE 2 % EX CREA: 1.0000 "application " | TOPICAL_CREAM | Freq: Every day | CUTANEOUS | 0 refills | Status: AC

## 2017-08-05 MED ORDER — ROSUVASTATIN CALCIUM 20 MG PO TABS: 20.0000 mg | ORAL_TABLET | Freq: Every day | ORAL | 3 refills | Status: DC

## 2017-08-05 MED ORDER — GLUCOSE BLOOD VI STRP: ORAL_STRIP | 3 refills | Status: DC

## 2017-08-05 MED ORDER — METFORMIN HCL 500 MG PO TABS: ORAL_TABLET | ORAL | 2 refills | Status: DC

## 2017-08-05 MED ORDER — ONETOUCH DELICA LANCETS 33G MISC: 3 refills | Status: DC

## 2017-08-05 MED FILL — FREESTYLE LITE TEST STRIP: 90 days supply | Qty: 100 | Fill #0

## 2017-08-05 MED FILL — KETOCONAZOLE 2% CREAM: 2 | 30 days supply | Qty: 60 | Fill #0

## 2017-08-05 MED FILL — FREESTYLE LITE METER: 30 days supply | Qty: 1 | Fill #0

## 2017-08-05 MED FILL — metFORMIN HCL 500 MG TABS: 500 | 30 days supply | Qty: 120 | Fill #0

## 2017-08-05 MED FILL — FREESTYLE LANCETS: 90 days supply | Qty: 100 | Fill #0

## 2017-08-05 MED FILL — ROSUVASTATIN CALCIUM 20 MG: 20 | 90 days supply | Qty: 90 | Fill #0

## 2017-08-05 NOTE — Progress Notes (Signed)
Pre visit review using our clinic review tool, if applicable. No additional management support is needed unless otherwise documented below in the visit note. 

## 2017-08-05 NOTE — Progress Notes (Signed)
Chief Complaint  Patient presents with  . Follow-up    labs    Subjective: Patient is a 47 y.o. female here for lab f/u.  Dx'd w DM II, A1c 7.0. Dyslipidemia noted also. Her diet is poor overall and her phys activity should be better. Denies CP or SOB. Feet are doing fine. She does have an eye appt coming up. Never had PCV23. Does not have a glucometer. Does have famhx of DM.    ROS: Heart: Denies chest pain  Lungs: Denies SOB   Family History  Problem Relation Age of Onset  . Cancer Mother        lung cancer   Past Medical History:  Diagnosis Date  . Anemia   . Hyperlipidemia    No Known Allergies  Current Outpatient Medications:  .  aspirin EC 81 MG tablet, Take 81 mg by mouth daily., Disp: , Rfl:  .  glucose blood test strip, Use three times per week to check blood sugar.  DXE11.9, Disp: 100 each, Rfl: 3 .  MULTIPLE VITAMINS PO, Take by mouth., Disp: , Rfl:  .  ONETOUCH DELICA LANCETS 33G MISC, Use as directed 3 times a week to check blood sugar.  DX E11.9, Disp: 100 each, Rfl: 3 .  ketoconazole (NIZORAL) 2 % cream, Apply 1 application topically daily., Disp: 60 g, Rfl: 0 .  metFORMIN (GLUCOPHAGE) 500 MG tablet, Week 1: 1 tab daily Week 2: 1 tab twice daily Week 3: 2 tabs in AM, 1 in evening Week 4: 2 tabs twice daily, Disp: 120 tablet, Rfl: 2 .  rosuvastatin (CRESTOR) 20 MG tablet, Take 1 tablet (20 mg total) by mouth daily., Disp: 90 tablet, Rfl: 3  Objective: BP 118/82 (BP Location: Left Arm, Patient Position: Sitting, Cuff Size: Large)   Pulse 96   Temp 98.5 F (36.9 C) (Oral)   Ht  (1.676 m)   Wt 247 lb 6 oz (112.2 kg)   SpO2 96%   BMI 39.93 kg/m  General: Awake, appears stated age Heart: RRR, no bruits, no LE edema Lungs: CTAB, no rales, wheezes or rhonchi. No accessory muscle use Neuro: sensation intact to pinprick b/l feet Skin: Macerated skin between toes b/l, no  Psych: Age appropriate judgment and insight, normal affect and mood  Assessment and  Plan: Type 2 diabetes mellitus without complication, without long-term current use of insulin (HCC) - Plan: ONETOUCH DELICA LANCETS 33G MISC, glucose blood test strip, Microalbumin / creatinine urine ratio, metFORMIN (GLUCOPHAGE) 500 MG tablet, rosuvastatin (CRESTOR) 20 MG tablet  Hyperlipidemia, unspecified hyperlipidemia type - Plan: rosuvastatin (CRESTOR) 20 MG tablet  Tinea pedis of both feet - Plan: ketoconazole (NIZORAL) 2 % cream  Orders as above. Start by checking sugars 2-3 times/week. Write down and bring to appt.  Eye appt next mo, [facility]. Ck urine today. Start statin, start metformin at pt request, did discuss possibility of lifestyle mods only. F/u in 3 mo, will need PCV23. The patient voiced understanding and agreement to the plan.  Jilda Roche Monon, DO 08/05/17  7:45 AM

## 2017-08-05 NOTE — Patient Instructions (Addendum)
Check your sugars around 2-3 times per week. Alternate checking in the morning before you eat, in the afternoon and before bed. Write them down and bring it to your next appointment.   Stay hydrated.  Keep the diet clean and stay active.  Let us know if you need anything.  Healthy Eating Plan Many factors influence your heart health, including eating and exercise habits. Heart (coronary) risk increases with abnormal blood fat (lipid) levels. Heart-healthy meal planning includes limiting unhealthy fats, increasing healthy fats, and making other small dietary changes. This includes maintaining a healthy body weight to help keep lipid levels within a normal range.  WHAT IS MY PLAN?  Your health care provider recommends that you:  Drink a glass of water before meals to help with satiety.  Eat slowly.  An alternative to the water is to add Metamucil. This will help with satiety as well. It does contain calories, unlike water.  WHAT TYPES OF FAT SHOULD I CHOOSE?  Choose healthy fats more often. Choose monounsaturated and polyunsaturated fats, such as olive oil and canola oil, flaxseeds, walnuts, almonds, and seeds.  Eat more omega-3 fats. Good choices include salmon, mackerel, sardines, tuna, flaxseed oil, and ground flaxseeds. Aim to eat fish at least two times each week.  Avoid foods with partially hydrogenated oils in them. These contain trans fats. Examples of foods that contain trans fats are stick margarine, some tub margarines, cookies, crackers, and other baked goods. If you are going to avoid a fat, this is the one to avoid!  WHAT GENERAL GUIDELINES DO I NEED TO FOLLOW?  Check food labels carefully to identify foods with trans fats. Avoid these types of options when possible.  Fill one half of your plate with vegetables and green salads. Eat 4-5 servings of vegetables per day. A serving of vegetables equals 1 cup of raw leafy vegetables,  cup of raw or cooked cut-up vegetables, or   cup of vegetable juice.  Fill one fourth of your plate with whole grains. Look for the word "whole" as the first word in the ingredient list.  Fill one fourth of your plate with lean protein foods.  Eat 4-5 servings of fruit per day. A serving of fruit equals one medium whole fruit,  cup of dried fruit,  cup of fresh, frozen, or canned fruit. Try to avoid fruits in cups/syrups as the sugar content can be high.  Eat more foods that contain soluble fiber. Examples of foods that contain this type of fiber are apples, broccoli, carrots, beans, peas, and barley. Aim to get 20-30 g of fiber per day.  Eat more home-cooked food and less restaurant, buffet, and fast food.  Limit or avoid alcohol.  Limit foods that are high in starch and sugar.  Avoid fried foods when able.  Cook foods by using methods other than frying. Baking, boiling, grilling, and broiling are all great options. Other fat-reducing suggestions include: ? Removing the skin from poultry. ? Removing all visible fats from meats. ? Skimming the fat off of stews, soups, and gravies before serving them. ? Steaming vegetables in water or broth.  Lose weight if you are overweight. Losing just 5-10% of your initial body weight can help your overall health and prevent diseases such as diabetes and heart disease.  Increase your consumption of nuts, legumes, and seeds to 4-5 servings per week. One serving of dried beans or legumes equals  cup after being cooked, one serving of nuts equals 1 ounces, and one  serving of seeds equals  ounce or 1 tablespoon.  WHAT ARE GOOD FOODS CAN I EAT? Grains Grainy breads (try to find bread that is 3 g of fiber per slice or greater), oatmeal, light popcorn. Whole-grain cereals. Rice and pasta, including brown rice and those that are made with whole wheat. Edamame pasta is a great alternative to grain pasta. It has a higher protein content. Try to avoid significant consumption of white bread, sugary  cereals, or pastries/baked goods.  Vegetables All vegetables. Cooked white potatoes do not count as vegetables.  Fruits All fruits, but limit pineapple and bananas as these fruits have a higher sugar content.  Meats and Other Protein Sources Lean, well-trimmed beef, veal, pork, and lamb. Chicken and [place] without skin. All fish and shellfish. Wild duck, rabbit, pheasant, and venison. Egg whites or low-cholesterol egg substitutes. Dried beans, peas, lentils, and tofu.Seeds and most nuts.  Dairy Low-fat or nonfat cheeses, including ricotta, string, and mozzarella. Skim or 1% milk that is liquid, powdered, or evaporated. Buttermilk that is made with low-fat milk. Nonfat or low-fat yogurt. Soy/Almond milk are good alternatives if you cannot handle dairy.  Beverages Water is the best for you. Sports drinks with less sugar are more desirable unless you are a highly active athlete.  Sweets and Desserts Sherbets and fruit ices. Honey, jam, marmalade, jelly, and syrups. Dark chocolate.  Eat all sweets and desserts in moderation.  Fats and Oils Nonhydrogenated (trans-free) margarines. Vegetable oils, including soybean, sesame, sunflower, olive, peanut, safflower, corn, canola, and cottonseed. Salad dressings or mayonnaise that are made with a vegetable oil. Limit added fats and oils that you use for cooking, baking, salads, and as spreads.  Other Cocoa powder. Coffee and tea. Most condiments.  The items listed above may not be a complete list of recommended foods or beverages. Contact your dietitian for more options.

## 2017-08-26 ENCOUNTER — Telehealth: Payer: Self-pay | Admitting: Family Medicine

## 2017-08-26 DIAGNOSIS — Z01419 Encounter for gynecological examination (general) (routine) without abnormal findings: Secondary | ICD-10-CM | POA: Diagnosis not present

## 2017-08-26 DIAGNOSIS — R87610 Atypical squamous cells of undetermined significance on cytologic smear of cervix (ASC-US): Secondary | ICD-10-CM | POA: Diagnosis not present

## 2017-08-26 DIAGNOSIS — R635 Abnormal weight gain: Secondary | ICD-10-CM

## 2017-08-26 DIAGNOSIS — Z6838 Body mass index (BMI) 38.0-38.9, adult: Secondary | ICD-10-CM | POA: Diagnosis not present

## 2017-08-26 DIAGNOSIS — Z1151 Encounter for screening for human papillomavirus (HPV): Secondary | ICD-10-CM | POA: Diagnosis not present

## 2017-08-26 NOTE — Telephone Encounter (Signed)
Patient requesting a referral to [facility].

## 2017-08-26 NOTE — Telephone Encounter (Signed)
Copied from CRM [id number]. Topic: Referral - Request >> Aug 26, 2017  2:59 PM Oneal Grout wrote: Reason for CRM: Requesting referral to [facility]

## 2017-08-26 NOTE — Addendum Note (Signed)
Addended by: Scharlene Gloss B on: 08/26/2017 04:33 PM   Modules accepted: Orders

## 2017-08-26 NOTE — Telephone Encounter (Signed)
Referral done

## 2017-08-26 NOTE — Telephone Encounter (Signed)
OK 

## 2017-09-13 MED FILL — metFORMIN HCL 500 MG TABS: 500 | 30 days supply | Qty: 120 | Fill #1

## 2017-09-25 ENCOUNTER — Telehealth: Payer: Self-pay | Admitting: Family Medicine

## 2017-09-25 NOTE — Telephone Encounter (Signed)
OK. Dx obesity.

## 2017-09-25 NOTE — Telephone Encounter (Signed)
Copied from CRM [id number]. Topic: Referral - Request >> Sep 25, 2017  3:39 PM Marylen PontoMcneil, Ja-Kwan wrote: Reason for CRM: Pt states she needs a referral for a Nutritionist Diabetes. Pt request call back. Cb# [phone]

## 2017-09-26 ENCOUNTER — Other Ambulatory Visit: Payer: Self-pay | Admitting: Family Medicine

## 2017-09-26 NOTE — Telephone Encounter (Signed)
Referral done

## 2017-10-02 MED FILL — metFORMIN HCL 500 MG TABS: 500 | 30 days supply | Qty: 120 | Fill #2

## 2017-10-31 ENCOUNTER — Encounter (INDEPENDENT_AMBULATORY_CARE_PROVIDER_SITE_OTHER): Payer: Self-pay

## 2017-11-04 ENCOUNTER — Encounter: Payer: Self-pay | Admitting: Family Medicine

## 2017-11-04 ENCOUNTER — Ambulatory Visit (INDEPENDENT_AMBULATORY_CARE_PROVIDER_SITE_OTHER): Payer: 59 | Admitting: Family Medicine

## 2017-11-04 VITALS — BP 130/78 | HR 89 | Temp 98.5°F | Ht 66.0 in | Wt 238.4 lb

## 2017-11-04 DIAGNOSIS — E119 Type 2 diabetes mellitus without complications: Secondary | ICD-10-CM

## 2017-11-04 DIAGNOSIS — Z23 Encounter for immunization: Secondary | ICD-10-CM

## 2017-11-04 LAB — HEPATIC FUNCTION PANEL
ALT: 7 U/L (ref 0–35)
AST: 4 U/L (ref 0–37)
Albumin: 4 g/dL (ref 3.5–5.2)
Alkaline Phosphatase: 49 U/L (ref 39–117)
Bilirubin, Direct: 0.1 mg/dL (ref 0.0–0.3)
Total Bilirubin: 0.3 mg/dL (ref 0.2–1.2)
Total Protein: 7.3 g/dL (ref 6.0–8.3)

## 2017-11-04 LAB — LIPID PANEL
CHOL/HDL RATIO: 3
CHOLESTEROL: 110 mg/dL (ref 0–200)
HDL: 32 mg/dL — AB (ref >39.00)
LDL Cholesterol: 48 mg/dL (ref 0–99)
NonHDL: 78.13
TRIGLYCERIDES: 151 mg/dL — AB (ref 0.0–149.0)
VLDL: 30.2 mg/dL (ref 0.0–40.0)

## 2017-11-04 LAB — HEMOGLOBIN A1C: Hgb A1c MFr Bld: 6.7 % — ABNORMAL HIGH (ref 4.6–6.5)

## 2017-11-04 NOTE — Progress Notes (Signed)
Pre visit review using our clinic review tool, if applicable. No additional management support is needed unless otherwise documented below in the visit note. 

## 2017-11-04 NOTE — Patient Instructions (Addendum)
Make sure to get your eye exam!  Keep the diet clean and stay active.  Let us know if you need anything.

## 2017-11-04 NOTE — Progress Notes (Signed)
Subjective:   Chief Complaint  Patient presents with  . Follow-up    3 months    Ellen Mills is a 47 y.o. female here for follow-up of diabetes.   [name] self monitored glucose range is low 100's.  Patient denies hypoglycemic reactions. She checks her glucose levels 3x/week Patient does not require insulin.   Medications include: Metformin 1000 mg bid Reports diet is usually healthy overall. Patient has been walking. Her last A1c was 7.0.   Past Medical History:  Diagnosis Date  . Anemia   . Hyperlipidemia     Past Surgical History:  Procedure Laterality Date  . ABDOMINAL HYSTERECTOMY      Family History  Problem Relation Age of Onset  . Cancer Mother        lung cancer   Current Outpatient Medications on File Prior to Visit  Medication Sig Dispense Refill  . aspirin EC 81 MG tablet Take 81 mg by mouth daily.    Marland Kitchen. glucose blood test strip Use three times per week to check blood sugar.  DXE11.9 100 each 3  . metFORMIN (GLUCOPHAGE) 500 MG tablet Week 1: 1 tab daily Week 2: 1 tab twice daily Week 3: 2 tabs in AM, 1 in evening Week 4: 2 tabs twice daily 120 tablet 2  . MULTIPLE VITAMINS PO Take by mouth.    [name]. ONETOUCH DELICA LANCETS 33G MISC Use as directed 3 times a week to check blood sugar.  DX E11.9 100 each 3  . rosuvastatin (CRESTOR) 20 MG tablet Take 1 tablet (20 mg total) by mouth daily. 90 tablet 3    Related testing: Date of retinal exam: Needs to have done Pneumovax: Getting today Flu Shot: 01/07/2017  Review of Systems: Pulmonary:  No SOB Cardiovascular:  No chest pain  Objective:  BP 130/78 (BP Location: Left Arm, Patient Position: Sitting, Cuff Size: Large)   Pulse 89   Temp 98.5 F (36.9 C) (Oral)   Ht 5\' 6"  (1.676 m)   Wt 238 lb 6 oz (108.1 kg)   SpO2 96%   BMI 38.47 kg/m  General:  Well developed, well nourished, in no apparent distress Skin:  Warm, no pallor or diaphoresis Head:  Normocephalic, atraumatic Eyes:  Pupils equal and  round, sclera anicteric without injection  Lungs:  CTAB, no access msc use Cardio:  RRR, no bruits, no LE edema Musculoskeletal:  Symmetrical muscle groups noted without atrophy or deformity Neuro:  Sensation intact to pinprick on feet Psych: Age appropriate judgment and insight  Assessment:   Type 2 diabetes mellitus without complication, without long-term current use of insulin (HCC) - Plan: Hemoglobin A1c, Lipid panel, Hepatic function panel, HM Diabetes Foot Exam  Need for vaccination against Streptococcus pneumoniae - Plan: Pneumococcal polysaccharide vaccine 23-valent greater than or equal to 2yo subcutaneous/IM   Plan:   Orders as above. Counseled on diet and exercise.  She has lost 9 pounds.  She was encouraged to set up with her eye provider as she missed her first appointment. F/u in 6 mo unless her A1c is not controlled. The patient voiced understanding and agreement to the plan.  Jilda Rocheicholas Paul BennettWendling, DO 11/04/17 8:15 AM

## 2017-11-06 MED FILL — ROSUVASTATIN CALCIUM 20 MG: 20 | 90 days supply | Qty: 90 | Fill #1

## 2017-11-14 ENCOUNTER — Ambulatory Visit (INDEPENDENT_AMBULATORY_CARE_PROVIDER_SITE_OTHER): Payer: 59 | Admitting: Family Medicine

## 2017-11-14 ENCOUNTER — Other Ambulatory Visit: Payer: Self-pay | Admitting: Family Medicine

## 2017-11-14 ENCOUNTER — Encounter (INDEPENDENT_AMBULATORY_CARE_PROVIDER_SITE_OTHER): Payer: Self-pay | Admitting: Family Medicine

## 2017-11-14 VITALS — BP 113/76 | HR 86 | Temp 98.1°F | Ht 67.0 in | Wt 232.0 lb

## 2017-11-14 DIAGNOSIS — Z6836 Body mass index (BMI) 36.0-36.9, adult: Secondary | ICD-10-CM

## 2017-11-14 DIAGNOSIS — Z1331 Encounter for screening for depression: Secondary | ICD-10-CM

## 2017-11-14 DIAGNOSIS — R0602 Shortness of breath: Secondary | ICD-10-CM

## 2017-11-14 DIAGNOSIS — Z9189 Other specified personal risk factors, not elsewhere classified: Secondary | ICD-10-CM

## 2017-11-14 DIAGNOSIS — Z0289 Encounter for other administrative examinations: Secondary | ICD-10-CM

## 2017-11-14 DIAGNOSIS — E785 Hyperlipidemia, unspecified: Secondary | ICD-10-CM

## 2017-11-14 DIAGNOSIS — R5383 Other fatigue: Secondary | ICD-10-CM | POA: Diagnosis not present

## 2017-11-14 DIAGNOSIS — E119 Type 2 diabetes mellitus without complications: Secondary | ICD-10-CM

## 2017-11-14 MED FILL — metFORMIN HCL 500 MG TABS: 500 | 30 days supply | Qty: 120 | Fill #0

## 2017-11-15 LAB — CBC WITH DIFFERENTIAL
BASOS: 0 %
Basophils Absolute: 0 10*3/uL (ref 0.0–0.2)
EOS (ABSOLUTE): 0.1 10*3/uL (ref 0.0–0.4)
Eos: 1 %
Hematocrit: 38.6 % (ref 34.0–46.6)
Hemoglobin: 12 g/dL (ref 11.1–15.9)
Immature Grans (Abs): 0 10*3/uL (ref 0.0–0.1)
Immature Granulocytes: 0 %
Lymphocytes Absolute: 3.9 10*3/uL — ABNORMAL HIGH (ref 0.7–3.1)
Lymphs: 43 %
MCH: 27.5 pg (ref 26.6–33.0)
MCHC: 31.1 g/dL — ABNORMAL LOW (ref 31.5–35.7)
MCV: 88 fL (ref 79–97)
MONOS ABS: 0.5 10*3/uL (ref 0.1–0.9)
Monocytes: 5 %
NEUTROS ABS: 4.6 10*3/uL (ref 1.4–7.0)
NEUTROS PCT: 51 %
RBC: 4.37 x10E6/uL (ref 3.77–5.28)
RDW: 14.2 % (ref 12.3–15.4)
WBC: 9.1 10*3/uL (ref 3.4–10.8)

## 2017-11-15 LAB — T3: T3, Total: 117 ng/dL (ref 71–180)

## 2017-11-15 LAB — COMPREHENSIVE METABOLIC PANEL
ALK PHOS: 58 IU/L (ref 39–117)
ALT: 9 IU/L (ref 0–32)
AST: 4 IU/L (ref 0–40)
Albumin/Globulin Ratio: 1.3 (ref 1.2–2.2)
Albumin: 4.4 g/dL (ref 3.5–5.5)
BUN/Creatinine Ratio: 9 (ref 9–23)
BUN: 7 mg/dL (ref 6–24)
Bilirubin Total: 0.4 mg/dL (ref 0.0–1.2)
CO2: 25 mmol/L (ref 20–29)
CREATININE: 0.76 mg/dL (ref 0.57–1.00)
Calcium: 9.6 mg/dL (ref 8.7–10.2)
Chloride: 105 mmol/L (ref 96–106)
GFR calc Af Amer: 108 mL/min/{1.73_m2} (ref >59)
GFR calc non Af Amer: 94 mL/min/{1.73_m2} (ref >59)
GLOBULIN, TOTAL: 3.3 g/dL (ref 1.5–4.5)
Glucose: 103 mg/dL — ABNORMAL HIGH (ref 65–99)
Potassium: 4.2 mmol/L (ref 3.5–5.2)
SODIUM: 145 mmol/L — AB (ref 134–144)
Total Protein: 7.7 g/dL (ref 6.0–8.5)

## 2017-11-15 LAB — INSULIN, RANDOM: INSULIN: 16.3 u[IU]/mL (ref 2.6–24.9)

## 2017-11-15 LAB — FOLATE: Folate: 14.7 ng/mL (ref >3.0)

## 2017-11-15 LAB — VITAMIN B12: Vitamin B-12: 347 pg/mL (ref 232–1245)

## 2017-11-15 LAB — TSH: TSH: 1.09 u[IU]/mL (ref 0.450–4.500)

## 2017-11-15 LAB — VITAMIN D 25 HYDROXY (VIT D DEFICIENCY, FRACTURES): Vit D, 25-Hydroxy: 25.6 ng/mL — ABNORMAL LOW (ref 30.0–100.0)

## 2017-11-15 LAB — T4, FREE: Free T4: 1.29 ng/dL (ref 0.82–1.77)

## 2017-11-18 NOTE — Progress Notes (Signed)
.  Office: [phone]  /  Fax: [phone]   HPI:   Chief Complaint: OBESITY  Ellen Mills (MR# [id number]) is a 47 y.o. female who presents on 11/18/2017 for obesity evaluation and treatment. Current BMI is Body mass index is 36.34 kg/m.Ellen Mills has struggled with obesity for years and has been unsuccessful in either losing weight or maintaining long term weight loss. Charnita found [place] through knowledge that [facility] had a clinic. Ellen Mills attended our information session and states she is currently in the action stage of change and ready to dedicate time achieving and maintaining a healthier weight.  Ellen Mills states her family eats meals together she thinks her family will eat healthier with  her her desired weight loss is 88 lbs she started gaining weight after children her heaviest weight ever was 280 lbs. she is a picky eater and doesn't like to eat healthier foods  she has significant food cravings issues  she snacks frequently in the evenings she is frequently drinking liquids with calories she frequently makes poor food choices she frequently eats larger portions than normal  she struggles with emotional eating    Ellen Mills feels her energy is lower than it should be. This has worsened with weight gain and has not worsened recently. Ellen Mills admits to daytime somnolence and  admits to waking up still tired. Patient is at risk for obstructive sleep apnea. Patent has a history of symptoms of daytime Ellen and morning Ellen. Patient generally gets 6 or 7 hours of sleep per night, and states they generally have restful sleep. Snoring is present. Apneic episodes are not present. Epworth Sleepiness Score is 4  EKG was ordered today and shows low voltage, but normal sinus rhythm.  Dyspnea on exertion Ellen Mills notes increasing shortness of breath with exercising and seems to be worsening over time with weight gain. She notes getting out of breath sooner with activity than she used to. This  has not gotten worse recently. EKG was ordered today and shows low voltage, but normal sinus rhythm. Ellen Mills denies orthopnea.  Diabetes II Ellen Mills has a diagnosis of diabetes type II. Ellen Mills is on Metformin 1,000 mg twice daily and denies any hypoglycemic episodes. Last A1c was at 6.7 She is attempting to work on intensive lifestyle modifications including diet, exercise, and weight loss to help control her blood glucose levels.  Hyperlipidemia Ellen Mills has hyperlipidemia and is attempting to improve her cholesterol levels with intensive lifestyle modification including a low saturated fat diet, exercise and weight loss. Her last fasting lipid panel was within normal limits. She denies any chest pain, claudication or myalgias.  At risk for cardiovascular disease Ellen Mills is at a higher than average risk for cardiovascular disease due to obesity, diabetes and hyperlipidemia. She currently denies any chest pain.  Depression Screen Ellen Mills's Food and Mood (modified PHQ-9) score was  Depression screen PHQ 2/9 11/14/2017  Decreased Interest 0  Down, Depressed, Hopeless 1  PHQ - 2 Score 1  Altered sleeping 1  Tired, decreased energy 1  Change in appetite 2  Feeling bad or failure about yourself  1  Trouble concentrating 2  Moving slowly or fidgety/restless 2  Suicidal thoughts 2  PHQ-9 Score 12  Difficult doing work/chores Somewhat difficult    ALLERGIES: No Known Allergies  MEDICATIONS: Current Outpatient Medications on File Prior to Visit  Medication Sig Dispense Refill  . aspirin EC 81 MG tablet Take 81 mg by mouth daily.    Ellen Kitchen glucose blood test strip Use  three times per week to check blood sugar.  DXE11.9 100 each 3  . ONETOUCH DELICA LANCETS 33G MISC Use as directed 3 times a week to check blood sugar.  DX E11.9 100 each 3  . rosuvastatin (CRESTOR) 20 MG tablet Take 1 tablet (20 mg total) by mouth daily. 90 tablet 3   No current facility-administered medications on file prior to visit.      PAST MEDICAL HISTORY: Past Medical History:  Diagnosis Date  . Anemia   . Diabetes (HCC)   . Hyperlipidemia   . Obesity     PAST SURGICAL HISTORY: Past Surgical History:  Procedure Laterality Date  . ABDOMINAL HYSTERECTOMY    . LEEP      SOCIAL HISTORY: Social History   Tobacco Use  . Smoking status: Never Smoker  . Smokeless tobacco: Current User  Substance Use Topics  . Alcohol use: No  . Drug use: No    FAMILY HISTORY: Family History  Problem Relation Age of Onset  . Cancer Mother        lung cancer  . Hyperlipidemia Mother   . Depression Mother   . Anxiety disorder Mother   . Depression Father   . Hypertension Father     ROS: Review of Systems  Constitutional: Positive for malaise/Ellen.  HENT: Positive for hearing loss.   Eyes:       Wear Glasses or Contacts  Respiratory: Positive for shortness of breath (on exertion).   Cardiovascular: Negative for chest pain, orthopnea and claudication.  Musculoskeletal: Negative for myalgias.  Skin:       Dryness  Neurological: Positive for dizziness and headaches.  Psychiatric/Behavioral: The patient has insomnia.     PHYSICAL EXAM: Blood pressure 113/76, pulse 86, temperature 98.1 F (36.7 C), temperature source Oral, height 5\' 7"  (1.702 m), weight 232 lb (105.2 kg), SpO2 99 %. Body mass index is 36.34 kg/m. Physical Exam  Constitutional: She is oriented to person, place, and time. She appears well-developed and well-nourished.  HENT:  Head: Normocephalic and atraumatic.  Nose: Nose normal.  Eyes: EOM are normal. No scleral icterus.  Neck: Normal range of motion. Neck supple. No thyromegaly present.  Cardiovascular: Normal rate and regular rhythm.  Pulmonary/Chest: Effort normal. No respiratory distress.  Abdominal: Soft. There is no tenderness.  Musculoskeletal: Normal range of motion.  Range of Motion normal in all 4 extremities  Neurological: She is alert and oriented to person, place, and  time. Coordination normal.  Skin: Skin is warm and dry.  Psychiatric: She has a normal mood and affect. Her behavior is normal.  Vitals reviewed.   RECENT LABS AND TESTS: BMET    Component Value Date/Time   NA 145 (H) 11/14/2017 1059   K 4.2 11/14/2017 1059   CL 105 11/14/2017 1059   CO2 25 11/14/2017 1059   GLUCOSE 103 (H) 11/14/2017 1059   GLUCOSE 132 (H) 03/15/2009 0521   BUN 7 11/14/2017 1059   CREATININE 0.76 11/14/2017 1059   CALCIUM 9.6 11/14/2017 1059   GFRNONAA 94 11/14/2017 1059   GFRAA 108 11/14/2017 1059   Lab Results  Component Value Date   HGBA1C 6.7 (H) 11/04/2017   Lab Results  Component Value Date   INSULIN 16.3 11/14/2017   CBC    Component Value Date/Time   WBC 9.1 11/14/2017 1059   WBC 11.0 (H) 03/15/2009 0521   RBC 4.37 11/14/2017 1059   RBC 3.69 (L) 03/15/2009 0521   HGB 12.0 11/14/2017 1059   HCT  38.6 11/14/2017 1059   PLT 397 (H) 07/30/2017 0735   MCV 88 11/14/2017 1059   MCH 27.5 11/14/2017 1059   MCHC 31.1 (L) 11/14/2017 1059   MCHC 33.2 03/15/2009 0521   RDW 14.2 11/14/2017 1059   LYMPHSABS 3.9 (H) 11/14/2017 1059   EOSABS 0.1 11/14/2017 1059   BASOSABS 0.0 11/14/2017 1059   Iron/TIBC/Ferritin/ %Sat No results found for: IRON, TIBC, FERRITIN, IRONPCTSAT Lipid Panel     Component Value Date/Time   CHOL 110 11/04/2017 0743   CHOL 212 (H) 07/30/2017 0735   TRIG 151.0 (H) 11/04/2017 0743   HDL 32.00 (L) 11/04/2017 0743   HDL 32 (L) 07/30/2017 0735   CHOLHDL 3 11/04/2017 0743   VLDL 30.2 11/04/2017 0743   LDLCALC 48 11/04/2017 0743   LDLCALC 152 (H) 07/30/2017 0735   Hepatic Function Panel     Component Value Date/Time   PROT 7.7 11/14/2017 1059   ALBUMIN 4.4 11/14/2017 1059   AST 4 11/14/2017 1059   ALT 9 11/14/2017 1059   ALKPHOS 58 11/14/2017 1059   BILITOT 0.4 11/14/2017 1059   BILIDIR 0.1 11/04/2017 0743      Component Value Date/Time   TSH 1.090 11/14/2017 1059   Vitamin D There are no recent lab  results  ECG  shows NSR with a rate of 71 INDIRECT CALORIMETER done today shows a VO2 of 189 and a REE of 1316. Her calculated basal metabolic rate is 4540 thus her basal metabolic rate is worse than expected.    ASSESSMENT AND PLAN: Other Ellen - Plan: EKG 12-Lead, VITAMIN D 25 Hydroxy (Vit-D Deficiency, Fractures), Vitamin B12, Folate, T3, T4, free, TSH  Shortness of breath on exertion  Type 2 diabetes mellitus without complication, without long-term current use of insulin (HCC) - Plan: Comprehensive metabolic panel, CBC With Differential, Insulin, random  Hyperlipidemia, unspecified hyperlipidemia type  Depression screening  At risk for heart disease  Class 2 severe obesity with serious comorbidity and body mass index (BMI) of 36.0 to 36.9 in adult, unspecified obesity type (HCC)  PLAN:  Ellen Silas was informed that her Ellen may be related to obesity, depression or many other causes. Labs will be ordered, and in the meanwhile Denisa has agreed to work on diet, exercise and weight loss to help with Ellen. Proper sleep hygiene was discussed including the need for 7-8 hours of quality sleep each night. A sleep study was not ordered based on symptoms and Epworth score. We will order EKG and indirect calorimetry today.  Dyspnea on exertion Makila's shortness of breath appears to be obesity related and exercise induced. She has agreed to work on weight loss and gradually increase exercise to treat her exercise induced shortness of breath. If Malin follows our instructions and loses weight without improvement of her shortness of breath, we will plan to refer to pulmonology. We will order labs, EKG and indirect calorimetry today. We will monitor this condition regularly. Shantice agrees to this plan.  Diabetes II Debby has been given extensive diabetes education by myself today including ideal fasting and post-prandial blood glucose readings, individual ideal Hgb A1c goals and  hypoglycemia prevention. We discussed the importance of good blood sugar control to decrease the likelihood of diabetic complications such as nephropathy, neuropathy, limb loss, blindness, coronary artery disease, and death. We discussed the importance of intensive lifestyle modification including diet, exercise and weight loss as the first line treatment for diabetes. Analucia agrees to continue her diabetes medications and we will check insulin  level today. [name] will follow up at the agreed upon time.  Hyperlipidemia [name] was informed of the American Heart Association Guidelines emphasizing intensive lifestyle modifications as the first line treatment for hyperlipidemia. We discussed many lifestyle modifications today in depth, and [name] will work on decreasing saturated fats such as fatty red meat, butter and many fried foods. She will also increase vegetables and lean protein in her diet and continue to work on exercise and weight loss efforts. [name] will continue statin and follow up as directed.  Cardiovascular risk counseling [name] was given extended (15 minutes) coronary artery disease prevention counseling today. She is 47 y.o. female and has risk factors for heart disease including obesity, diabetes and hyperlipidemia We discussed intensive lifestyle modifications today with an emphasis on specific weight loss instructions and strategies. Pt was also informed of the importance of increasing exercise and decreasing saturated fats to help prevent heart disease.      Depression Screen [name] had a moderately positive depression screening. Depression is commonly associated with obesity and often results in emotional eating behaviors. We will monitor this closely and work on CBT to help improve the non-hunger eating patterns. Referral to Psychology may be required if no improvement is seen as she continues in our clinic.  Obesity [name] is currently in the action stage of change and her  goal is to continue with weight loss efforts She has agreed to follow the Category 2 plan [name] has been instructed to work up to a goal of 150 minutes of combined cardio and strengthening exercise per week for weight loss and overall health benefits. We discussed the following Behavioral Modification Strategies today: planning for success, increasing lean protein intake, increasing vegetables, work on meal planning and easy cooking plans and dealing with family or coworker sabotage  [name] has agreed to follow up with our clinic in 2 weeks. She was informed of the importance of frequent follow up visits to maximize her success with intensive lifestyle modifications for her multiple health conditions. She was informed we would discuss her lab results at her next visit unless there is a critical issue that needs to be addressed sooner. [name] agreed to keep her next visit at the agreed upon time to discuss these results.    OBESITY BEHAVIORAL INTERVENTION VISIT  Today's visit was # 1 out of 22.  Starting weight: 232 lbs Starting date: 11/14/17 Today's weight : 232 lbs Today's date: 11/14/2017 Total lbs lost to date: 0    ASK: We discussed the diagnosis of obesity with Sherrilyn RistGesila C Martindelcampo today and [name] agreed to give us permission to discuss obesity behavioral modification therapy today.  ASSESS: [name] has the diagnosis of obesity and her BMI today is 36.33 [name] is in the action stage of change   ADVISE: [name] was educated on the multiple health risks of obesity as well as the benefit of weight loss to improve her health. She was advised of the need for long term treatment and the importance of lifestyle modifications.  AGREE: Multiple dietary modification options and treatment options were discussed and  [name] agreed to the above obesity treatment plan.   I, [place] CraneJoanne Murray, am acting as transcriptionist for Filbert SchilderAlexandria U. Kadolph, MD     I have reviewed the above documentation  for accuracy and completeness, and I agree with the above. - Debbra RidingAlexandria Kadolph, MD

## 2017-11-28 ENCOUNTER — Ambulatory Visit (INDEPENDENT_AMBULATORY_CARE_PROVIDER_SITE_OTHER): Payer: 59 | Admitting: Family Medicine

## 2017-11-28 ENCOUNTER — Encounter (INDEPENDENT_AMBULATORY_CARE_PROVIDER_SITE_OTHER): Payer: Self-pay | Admitting: Family Medicine

## 2017-11-28 VITALS — BP 100/68 | HR 78 | Temp 98.1°F | Ht 67.0 in | Wt 231.0 lb

## 2017-11-28 DIAGNOSIS — E119 Type 2 diabetes mellitus without complications: Secondary | ICD-10-CM

## 2017-11-28 DIAGNOSIS — E559 Vitamin D deficiency, unspecified: Secondary | ICD-10-CM

## 2017-11-28 DIAGNOSIS — Z9189 Other specified personal risk factors, not elsewhere classified: Secondary | ICD-10-CM | POA: Diagnosis not present

## 2017-11-28 DIAGNOSIS — Z6836 Body mass index (BMI) 36.0-36.9, adult: Secondary | ICD-10-CM

## 2017-11-28 MED ORDER — VITAMIN D (ERGOCALCIFEROL) 1.25 MG (50000 UNIT) PO CAPS: 50000.0000 [IU] | ORAL_CAPSULE | ORAL | 0 refills | Status: DC

## 2017-11-28 MED FILL — VIT D2 1.25 MG (50,000 UNIT: 1.25 MG | 28 days supply | Qty: 4 | Fill #0

## 2017-12-02 NOTE — Progress Notes (Signed)
Office: [phone]  /  Fax: [phone]   HPI:   Chief Complaint: OBESITY Ellen Mills is here to discuss her progress with her obesity treatment plan. She is on the Category 2 plan and is following her eating plan approximately 50 % of the time. She states she is walking 60 minutes 3 times per week. Hollie finds her hardest meal is lunch. She finds it hard to get all the food in. She denies any hunger. Her weight is 231 lb (104.8 kg) today and has had a weight loss of 1 pound over a period of 2 weeks since her last visit. She has lost 1 lb since starting treatment with [place].  Vitamin D deficiency Ellen Mills has a diagnosis of vitamin D deficiency. She is not currently taking OTC vit D. Ellen Mills admits fatigue and denies nausea, vomiting or muscle weakness.  At risk for osteopenia and osteoporosis Ellen Mills is at higher risk of osteopenia and osteoporosis due to vitamin D deficiency.   Diabetes II Ellen Mills has a diagnosis of diabetes type II. Ellen Mills is now taking metformin 1,000 mg twice daily and she denies any GI side effects of metformin. [name] denies any hypoglycemic episodes. Last A1c was at 6.7 and Ellen Mills reports that she was just diagnosed in April 2019. She has been working on intensive lifestyle modifications including diet, exercise, and weight loss to help control her blood glucose levels.  ALLERGIES: No Known Allergies  MEDICATIONS: Current Outpatient Medications on File Prior to Visit  Medication Sig Dispense Refill  . aspirin EC 81 MG tablet Take 81 mg by mouth daily.    Marland Kitchen glucose blood test strip Use three times per week to check blood sugar.  DXE11.9 100 each 3  . metFORMIN (GLUCOPHAGE) 500 MG tablet TAKE 1 TABLET BY MOUTH DAILY WEEK 1, THEN 1 TAB 2 TIMES A DAY WEEK 2, 2 TABS IN AM & 1 TAB IN PM WEEK 3, 2 TABS 2 TIMES DAILY THEREAFTER 120 tablet 2  . ONETOUCH DELICA LANCETS 33G MISC Use as directed 3 times a week to check blood sugar.  DX E11.9 100 each 3  . rosuvastatin (CRESTOR)  20 MG tablet Take 1 tablet (20 mg total) by mouth daily. 90 tablet 3   No current facility-administered medications on file prior to visit.     PAST MEDICAL HISTORY: Past Medical History:  Diagnosis Date  . Anemia   . Diabetes (HCC)   . Hyperlipidemia   . Obesity     PAST SURGICAL HISTORY: Past Surgical History:  Procedure Laterality Date  . ABDOMINAL HYSTERECTOMY    . LEEP      SOCIAL HISTORY: Social History   Tobacco Use  . Smoking status: Never Smoker  . Smokeless tobacco: Current User  Substance Use Topics  . Alcohol use: No  . Drug use: No    FAMILY HISTORY: Family History  Problem Relation Age of Onset  . Cancer Mother        lung cancer  . Hyperlipidemia Mother   . Depression Mother   . Anxiety disorder Mother   . Depression Father   . Hypertension Father     ROS: Review of Systems  Constitutional: Positive for malaise/fatigue and weight loss.  Gastrointestinal: Negative for diarrhea, nausea and vomiting.  Musculoskeletal:       Negative for muscle weakness  Endo/Heme/Allergies:       Negative for hypoglycemia    PHYSICAL EXAM: Blood pressure 100/68, pulse 78, temperature 98.1 F (36.7 Mills), temperature source Oral,  height 5\' 7"  (1.702 m), weight 231 lb (104.8 kg), SpO2 98 %. Body mass index is 36.18 kg/m. Physical Exam  Constitutional: She is oriented to person, place, and time. She appears well-developed and well-nourished.  Cardiovascular: Normal rate.  Pulmonary/Chest: Effort normal.  Musculoskeletal: Normal range of motion.  Neurological: She is oriented to person, place, and time.  Skin: Skin is warm and dry.  Psychiatric: She has a normal mood and affect. Her behavior is normal.  Vitals reviewed.   RECENT LABS AND TESTS: BMET    Component Value Date/Time   NA 145 (H) 11/14/2017 1059   K 4.2 11/14/2017 1059   CL 105 11/14/2017 1059   CO2 25 11/14/2017 1059   GLUCOSE 103 (H) 11/14/2017 1059   GLUCOSE 132 (H) 03/15/2009 0521    BUN 7 11/14/2017 1059   CREATININE 0.76 11/14/2017 1059   CALCIUM 9.6 11/14/2017 1059   GFRNONAA 94 11/14/2017 1059   GFRAA 108 11/14/2017 1059   Lab Results  Component Value Date   HGBA1C 6.7 (H) 11/04/2017   HGBA1C 7.0 (H) 07/30/2017   Lab Results  Component Value Date   INSULIN 16.3 11/14/2017   CBC    Component Value Date/Time   WBC 9.1 11/14/2017 1059   WBC 11.0 (H) 03/15/2009 0521   RBC 4.37 11/14/2017 1059   RBC 3.69 (L) 03/15/2009 0521   HGB 12.0 11/14/2017 1059   HCT 38.6 11/14/2017 1059   PLT 397 (H) 07/30/2017 0735   MCV 88 11/14/2017 1059   MCH 27.5 11/14/2017 1059   MCHC 31.1 (L) 11/14/2017 1059   MCHC 33.2 03/15/2009 0521   RDW 14.2 11/14/2017 1059   LYMPHSABS 3.9 (H) 11/14/2017 1059   EOSABS 0.1 11/14/2017 1059   BASOSABS 0.0 11/14/2017 1059   Iron/TIBC/Ferritin/ %Sat No results found for: IRON, TIBC, FERRITIN, IRONPCTSAT Lipid Panel     Component Value Date/Time   CHOL 110 11/04/2017 0743   CHOL 212 (H) 07/30/2017 0735   TRIG 151.0 (H) 11/04/2017 0743   HDL 32.00 (L) 11/04/2017 0743   HDL 32 (L) 07/30/2017 0735   CHOLHDL 3 11/04/2017 0743   VLDL 30.2 11/04/2017 0743   LDLCALC 48 11/04/2017 0743   LDLCALC 152 (H) 07/30/2017 0735   Hepatic Function Panel     Component Value Date/Time   PROT 7.7 11/14/2017 1059   ALBUMIN 4.4 11/14/2017 1059   AST 4 11/14/2017 1059   ALT 9 11/14/2017 1059   ALKPHOS 58 11/14/2017 1059   BILITOT 0.4 11/14/2017 1059   BILIDIR 0.1 11/04/2017 0743      Component Value Date/Time   TSH 1.090 11/14/2017 1059   Results for Ellen Mills, Ellen Mills (MRN [id number]) as of 12/02/2017 09:06  Ref. Range 11/14/2017 10:59  Vitamin D, 25-Hydroxy Latest Ref Range: 30.0 - 100.0 ng/mL 25.6 (L)   ASSESSMENT AND PLAN: Vitamin D deficiency - Plan: Vitamin D, Ergocalciferol, (DRISDOL) 50000 units CAPS capsule  Type 2 diabetes mellitus without complication, without long-term current use of insulin (HCC)  At risk for  osteoporosis  Class 2 severe obesity with serious comorbidity and body mass index (BMI) of 36.0 to 36.9 in adult, unspecified obesity type (HCC)  PLAN:  Vitamin D Deficiency [name] was informed that low vitamin D levels contributes to fatigue and are associated with obesity, breast, and colon cancer. She agrees to continue to take prescription Vit D @50 ,000 IU every week #4 with no refills and will follow up for routine testing of vitamin D, at least 2-3 times  per year. She was informed of the risk of over-replacement of vitamin D and agrees to not increase her dose unless she discusses this with [place] first. Tocara agrees to follow up as directed.  At risk for osteopenia and osteoporosis Shakeia is at risk for osteopenia and osteoporosis due to her vitamin D deficiency. She was encouraged to take her vitamin D and follow her higher calcium diet and increase strengthening exercise to help strengthen her bones and decrease her risk of osteopenia and osteoporosis.  Diabetes II Ketra has been given extensive diabetes education by myself today including ideal fasting and post-prandial blood glucose readings, individual ideal Hgb A1c goals and hypoglycemia prevention. We discussed the importance of good blood sugar control to decrease the likelihood of diabetic complications such as nephropathy, neuropathy, limb loss, blindness, coronary artery disease, and death. We discussed the importance of intensive lifestyle modification including diet, exercise and weight loss as the first line treatment for diabetes. We will repeat labs in 3 months and Tequia agrees to try Victoza if her labs have not improved. Illyria agreed to follow up at the agreed upon time.  Obesity Toni is currently in the action stage of change. As such, her goal is to continue with weight loss efforts She has agreed to follow the Category 2 plan Amazin has been instructed to work up to a goal of 150 minutes of combined cardio and  strengthening exercise per week for weight loss and overall health benefits. We discussed the following Behavioral Modification Strategies today: better snacking choices, planning for success, increasing lean protein intake and work on meal planning and easy cooking plans  Joyclyn has agreed to follow up with our clinic in 2 weeks. She was informed of the importance of frequent follow up visits to maximize her success with intensive lifestyle modifications for her multiple health conditions.   OBESITY BEHAVIORAL INTERVENTION VISIT  Today's visit was # 2   Starting weight: 232 lbs Starting date: 11/14/17 Today's weight : 231 lbs  Today's date: 11/28/2017 Total lbs lost to date: 1 At least 15 minutes were spent on discussing the following behavioral intervention visit.   ASK: We discussed the diagnosis of obesity with Ellen Rist today and Lucine agreed to give [place] permission to discuss obesity behavioral modification therapy today.  ASSESS: Lorisa has the diagnosis of obesity and her BMI today is 36.17 Cam is in the action stage of change   ADVISE: Topaz was educated on the multiple health risks of obesity as well as the benefit of weight loss to improve her health. She was advised of the need for long term treatment and the importance of lifestyle modifications to improve her current health and to decrease her risk of future health problems.  AGREE: Multiple dietary modification options and treatment options were discussed and  Dagny agreed to follow the recommendations documented in the above note.  ARRANGE: Shaterria was educated on the importance of frequent visits to treat obesity as outlined per CMS and USPSTF guidelines and agreed to schedule her next follow up appointment today.  I, [place] Crane, am acting as Energy manager for Filbert Schilder  I have reviewed the above documentation for accuracy and completeness, and I agree with the above. - Debbra Riding,  MD

## 2017-12-12 ENCOUNTER — Encounter (INDEPENDENT_AMBULATORY_CARE_PROVIDER_SITE_OTHER): Payer: Self-pay

## 2017-12-12 ENCOUNTER — Ambulatory Visit (INDEPENDENT_AMBULATORY_CARE_PROVIDER_SITE_OTHER): Payer: Self-pay | Admitting: Physician Assistant

## 2017-12-13 DIAGNOSIS — H5203 Hypermetropia, bilateral: Secondary | ICD-10-CM | POA: Diagnosis not present

## 2017-12-13 DIAGNOSIS — H524 Presbyopia: Secondary | ICD-10-CM | POA: Diagnosis not present

## 2017-12-13 DIAGNOSIS — H52203 Unspecified astigmatism, bilateral: Secondary | ICD-10-CM | POA: Diagnosis not present

## 2017-12-13 DIAGNOSIS — E119 Type 2 diabetes mellitus without complications: Secondary | ICD-10-CM | POA: Diagnosis not present

## 2017-12-13 LAB — HM DIABETES EYE EXAM

## 2017-12-20 MED FILL — metFORMIN HCL 500 MG TABS: 500 | 30 days supply | Qty: 120 | Fill #1

## 2018-02-03 MED FILL — metFORMIN HCL 500 MG TABS: 500 | 30 days supply | Qty: 120 | Fill #2

## 2018-02-03 MED FILL — ROSUVASTATIN CALCIUM 20 MG: 20 | 90 days supply | Qty: 90 | Fill #2

## 2018-03-17 ENCOUNTER — Other Ambulatory Visit: Payer: Self-pay | Admitting: Family Medicine

## 2018-03-17 DIAGNOSIS — E119 Type 2 diabetes mellitus without complications: Secondary | ICD-10-CM

## 2018-03-17 MED FILL — metFORMIN HCL 500 MG TABS: 500 | 30 days supply | Qty: 120 | Fill #0

## 2018-03-28 MED FILL — HYDROCODON-APAP 5-325: 5-325 | 4 days supply | Qty: 20 | Fill #0

## 2018-03-28 MED FILL — PENICILLIN VK 500 MG TABLET: 500 | 7 days supply | Qty: 28 | Fill #0

## 2018-04-21 ENCOUNTER — Other Ambulatory Visit: Payer: Self-pay | Admitting: Obstetrics and Gynecology

## 2018-04-21 DIAGNOSIS — Z1231 Encounter for screening mammogram for malignant neoplasm of breast: Secondary | ICD-10-CM

## 2018-04-24 MED FILL — ROSUVASTATIN CALCIUM 20 MG: 20 | 90 days supply | Qty: 90 | Fill #3

## 2018-04-24 MED FILL — metFORMIN HCL 500 MG TABS: 500 | 30 days supply | Qty: 120 | Fill #1

## 2018-05-07 ENCOUNTER — Ambulatory Visit: Payer: 59 | Admitting: Family Medicine

## 2018-06-02 MED FILL — metFORMIN HCL 500 MG TABS: 500 | 17 days supply | Qty: 70 | Fill #2

## 2018-06-21 ENCOUNTER — Other Ambulatory Visit: Payer: Self-pay | Admitting: Family Medicine

## 2018-06-21 DIAGNOSIS — E119 Type 2 diabetes mellitus without complications: Secondary | ICD-10-CM

## 2018-06-23 MED FILL — metFORMIN HCL 500 MG TABS: 500 | 30 days supply | Qty: 120 | Fill #0 | Status: TO

## 2018-07-17 ENCOUNTER — Other Ambulatory Visit: Payer: Self-pay | Admitting: Family Medicine

## 2018-07-17 DIAGNOSIS — E119 Type 2 diabetes mellitus without complications: Secondary | ICD-10-CM

## 2018-07-17 DIAGNOSIS — E785 Hyperlipidemia, unspecified: Secondary | ICD-10-CM

## 2018-07-17 MED FILL — metFORMIN HCL 500 MG TABS: 500 | 30 days supply | Qty: 120 | Fill #0

## 2018-07-17 MED FILL — ROSUVASTATIN CALCIUM 20 MG: 20 | 90 days supply | Qty: 90 | Fill #0

## 2018-07-21 MED FILL — AMOXICILLIN 500 MG CAPSULE: 500 | 7 days supply | Qty: 28 | Fill #0

## 2018-07-29 MED FILL — HYDROCODON-APAP 5-325: 5-325 | 3 days supply | Qty: 14 | Fill #0

## 2018-07-31 ENCOUNTER — Ambulatory Visit: Payer: 59

## 2018-08-01 ENCOUNTER — Encounter: Payer: 59 | Admitting: Family Medicine

## 2018-09-10 MED FILL — metFORMIN HCL 500 MG TABS: 500 | 30 days supply | Qty: 120 | Fill #1

## 2018-09-22 ENCOUNTER — Ambulatory Visit
Admission: RE | Admit: 2018-09-22 | Discharge: 2018-09-22 | Disposition: A | Discharge status: Home / Self Care | Payer: 59 | Source: Ambulatory Visit | Attending: Obstetrics and Gynecology | Admitting: Obstetrics and Gynecology

## 2018-09-22 ENCOUNTER — Other Ambulatory Visit: Payer: Self-pay

## 2018-09-22 ENCOUNTER — Ambulatory Visit: Payer: 59

## 2018-09-22 DIAGNOSIS — Z1231 Encounter for screening mammogram for malignant neoplasm of breast: Secondary | ICD-10-CM

## 2018-10-22 ENCOUNTER — Other Ambulatory Visit: Payer: Self-pay | Admitting: Family Medicine

## 2018-10-22 DIAGNOSIS — E119 Type 2 diabetes mellitus without complications: Secondary | ICD-10-CM

## 2018-10-22 MED ORDER — METFORMIN HCL 500 MG PO TABS: ORAL_TABLET | ORAL | 0 refills | Status: DC

## 2018-10-22 MED FILL — ROSUVASTATIN CALCIUM 20 MG: 20 | 90 days supply | Qty: 90 | Fill #1

## 2018-10-22 MED FILL — metFORMIN HCL 500 MG TABS: 500 | 30 days supply | Qty: 120 | Fill #0

## 2018-10-24 ENCOUNTER — Other Ambulatory Visit (INDEPENDENT_AMBULATORY_CARE_PROVIDER_SITE_OTHER): Payer: 59

## 2018-10-24 ENCOUNTER — Encounter: Payer: Self-pay | Admitting: Family Medicine

## 2018-10-24 ENCOUNTER — Other Ambulatory Visit: Payer: Self-pay

## 2018-10-24 ENCOUNTER — Ambulatory Visit (INDEPENDENT_AMBULATORY_CARE_PROVIDER_SITE_OTHER): Payer: 59 | Admitting: Family Medicine

## 2018-10-24 VITALS — BP 108/68 | HR 111 | Temp 98.5°F | Ht 66.0 in | Wt 238.5 lb

## 2018-10-24 DIAGNOSIS — E119 Type 2 diabetes mellitus without complications: Secondary | ICD-10-CM

## 2018-10-24 DIAGNOSIS — Z0000 Encounter for general adult medical examination without abnormal findings: Secondary | ICD-10-CM

## 2018-10-24 LAB — CBC
HCT: 38.1 % (ref 36.0–46.0)
Hemoglobin: 12.5 g/dL (ref 12.0–15.0)
MCHC: 32.7 g/dL (ref 30.0–36.0)
MCV: 86.9 fl (ref 78.0–100.0)
Platelets: 350 10*3/uL (ref 150.0–400.0)
RBC: 4.38 Mil/uL (ref 3.87–5.11)
RDW: 13.9 % (ref 11.5–15.5)
WBC: 8.1 10*3/uL (ref 4.0–10.5)

## 2018-10-24 LAB — HEMOGLOBIN A1C: Hgb A1c MFr Bld: 6.8 % — ABNORMAL HIGH (ref 4.6–6.5)

## 2018-10-24 LAB — LIPID PANEL
Cholesterol: 126 mg/dL (ref 0–200)
HDL: 34 mg/dL — ABNORMAL LOW (ref >39.00)
LDL Cholesterol: 56 mg/dL (ref 0–99)
NonHDL: 91.9
Total CHOL/HDL Ratio: 4
Triglycerides: 178 mg/dL — ABNORMAL HIGH (ref 0.0–149.0)
VLDL: 35.6 mg/dL (ref 0.0–40.0)

## 2018-10-24 LAB — COMPREHENSIVE METABOLIC PANEL
ALT: 12 U/L (ref 0–35)
AST: 5 U/L (ref 0–37)
Albumin: 4.3 g/dL (ref 3.5–5.2)
Alkaline Phosphatase: 46 U/L (ref 39–117)
BUN: 10 mg/dL (ref 6–23)
CO2: 27 mEq/L (ref 19–32)
Calcium: 9.2 mg/dL (ref 8.4–10.5)
Chloride: 103 mEq/L (ref 96–112)
Creatinine, Ser: 0.8 mg/dL (ref 0.40–1.20)
GFR: 92.3 mL/min (ref >60.00)
Glucose, Bld: 124 mg/dL — ABNORMAL HIGH (ref 70–99)
Potassium: 4 mEq/L (ref 3.5–5.1)
Sodium: 139 mEq/L (ref 135–145)
Total Bilirubin: 0.3 mg/dL (ref 0.2–1.2)
Total Protein: 7.7 g/dL (ref 6.0–8.3)

## 2018-10-24 LAB — MICROALBUMIN / CREATININE URINE RATIO
Creatinine,U: 154.6 mg/dL
Microalb Creat Ratio: 0.6 mg/g (ref 0.0–30.0)
Microalb, Ur: 0.9 mg/dL (ref 0.0–1.9)

## 2018-10-24 MED ORDER — FLUTICASONE PROPIONATE 50 MCG/ACT NA SUSP: 2.0000 | Freq: Every day | NASAL | 6 refills | Status: DC

## 2018-10-24 MED FILL — FLUTICASONE PROP 50 MCG SPR: 50 | 30 days supply | Qty: 16 | Fill #0

## 2018-10-24 NOTE — Patient Instructions (Signed)
Trial a nasal spray for 2 months. If no better, send me a message  Give [place] 2-3 business days to get the results of your labs back.   Keep the diet clean and stay active.  If you continue to have low sugar readings, please let me know.  Let us know if you need anything.

## 2018-10-24 NOTE — Progress Notes (Signed)
Chief Complaint  Patient presents with  . Annual Exam     Well Woman Ellen Mills is here for a complete physical.   Her last physical was >1 year ago.  Current diet: in general, diet could be better Current exercise: none. Weight is fluctuating and she denies daytime fatigue out of the ordinary. No LMP recorded. Patient has had a hysterectomy.  Seatbelt? Yes  Health Maintenance Mammogram- Yes Tetanus- Yes Hep C screening- Yes HIV screening- Yes  Past Medical History:  Diagnosis Date  . Anemia   . Diabetes ([place])   . Hyperlipidemia   . Obesity      Past Surgical History:  Procedure Laterality Date  . ABDOMINAL HYSTERECTOMY    . LEEP      Medications  Current Outpatient Medications on File Prior to Visit  Medication Sig Dispense Refill  . aspirin EC 81 MG tablet Take 81 mg by mouth daily.    Marland Kitchen glucose blood test strip Use three times per week to check blood sugar.  DXE11.9 100 each 3  . metFORMIN (GLUCOPHAGE) 500 MG tablet TAKE 2 TABLETS BY MOUTH 2 TIMES A DAY 120 tablet 1  . ONETOUCH DELICA LANCETS 25K MISC Use as directed 3 times a week to check blood sugar.  DX E11.9 100 each 3  . rosuvastatin (CRESTOR) 20 MG tablet TAKE 1 TABLET BY MOUTH DAILY. 90 tablet 3  . Vitamin D, Ergocalciferol, (DRISDOL) 50000 units CAPS capsule Take 1 capsule (50,000 Units total) by mouth every 7 (seven) days. 4 capsule 0   Allergies No Known Allergies  Review of Systems: Constitutional:  no unexpected weight changes Eye:  no recent significant change in vision Ear/Nose/Mouth/Throat:  Ears:  no tinnitus or vertigo and no recent change in hearing Nose/Mouth/Throat:  no complaints of nasal congestion, no sore throat; +decreased taste Cardiovascular: no chest pain Respiratory:  no cough and no shortness of breath Gastrointestinal:  no abdominal pain, no change in bowel habits GU:  Female: negative for dysuria or pelvic pain Musculoskeletal/Extremities:  no pain of the  joints Integumentary (Skin/Breast):  no abnormal skin lesions reported Neurologic:  no headaches Endocrine:  denies fatigue Hematologic/Lymphatic:  No areas of easy bleeding  Exam BP 108/68 (BP Location: Left Arm, Patient Position: Sitting, Cuff Size: Large)   Pulse (!) 111   Temp 98.5 F (36.9 C) (Oral)   Ht 5\' 6"  (1.676 m)   Wt 238 lb 8 oz (108.2 kg)   SpO2 98%   BMI 38.49 kg/m  General:  well developed, well nourished, in no apparent distress Skin:  no significant moles, warts, or growths Head:  no masses, lesions, or tenderness Eyes:  pupils equal and round, sclera anicteric without injection Ears:  canals without lesions, TMs shiny without retraction, no obvious effusion, no erythema Nose:  nares patent, septum midline, mucosa normal, and no drainage or sinus tenderness Throat/Pharynx:  lips and gingiva without lesion; tongue and uvula midline; non-inflamed pharynx; no exudates or postnasal drainage Neck: neck supple without adenopathy, thyromegaly, or masses Lungs:  clear to auscultation, breath sounds equal bilaterally, no respiratory distress Cardio:  regular rate and rhythm, no bruits, no LE edema Abdomen:  abdomen soft, nontender; bowel sounds normal; no masses or organomegaly Genital: Defer to GYN Musculoskeletal:  symmetrical muscle groups noted without atrophy or deformity Extremities:  no clubbing, cyanosis, or edema, no deformities, no skin discoloration Neuro:  gait normal; deep tendon reflexes normal and symmetric Psych: well oriented with normal range of affect  and appropriate judgment/insight  Assessment and Plan  Well adult exam - Plan: Urine Microalbumin w/creat. ratio, Lipid panel, Comprehensive metabolic panel, CBC, Hemoglobin A1c  Type 2 diabetes mellitus without complication, without long-term current use of insulin (HCC) - Plan: metFORMIN (GLUCOPHAGE) 500 MG tablet, Urine Microalbumin w/creat. ratio  Well 48 y.o. female. Sugars a little low over past  several weeks, decrease Metformin to 1 tab bid from 2 tabs bid. Monitor sugars. MWM wanted to start Trulicity, probably to slow down GI transit to give more consistent sugar levels.  Trial INCS for 2 mo for taste, if no improvement, will refer to ENT. Counseled on diet and exercise. Other orders as above. Follow up in 6 mo. The patient voiced understanding and agreement to the plan.  Jilda Rocheicholas Paul OrrWendling, DO 10/24/18 8:00 AM

## 2018-12-16 MED FILL — metFORMIN HCL 500 MG TABS: 500 | 30 days supply | Qty: 120 | Fill #0

## 2019-01-09 ENCOUNTER — Encounter: Payer: Self-pay | Admitting: Family Medicine

## 2019-01-09 DIAGNOSIS — E119 Type 2 diabetes mellitus without complications: Secondary | ICD-10-CM | POA: Diagnosis not present

## 2019-01-09 DIAGNOSIS — H52203 Unspecified astigmatism, bilateral: Secondary | ICD-10-CM | POA: Diagnosis not present

## 2019-01-09 DIAGNOSIS — H524 Presbyopia: Secondary | ICD-10-CM | POA: Diagnosis not present

## 2019-01-09 LAB — HM DIABETES EYE EXAM

## 2019-01-22 MED FILL — ROSUVASTATIN CALCIUM 20 MG: 20 | 90 days supply | Qty: 90 | Fill #2

## 2019-01-28 MED FILL — ROSUVASTATIN CALCIUM 20 MG: 20 | 90 days supply | Qty: 90 | Fill #2

## 2019-02-17 MED FILL — metFORMIN HCL 500 MG TABS: 500 | 30 days supply | Qty: 120 | Fill #0

## 2019-04-17 ENCOUNTER — Other Ambulatory Visit: Payer: Self-pay | Admitting: Family Medicine

## 2019-04-17 DIAGNOSIS — E119 Type 2 diabetes mellitus without complications: Secondary | ICD-10-CM

## 2019-04-17 MED FILL — metFORMIN HCL 500 MG TABS: 500 | 30 days supply | Qty: 120 | Fill #0

## 2019-04-24 ENCOUNTER — Other Ambulatory Visit: Payer: Self-pay

## 2019-04-27 ENCOUNTER — Other Ambulatory Visit: Payer: Self-pay

## 2019-04-27 ENCOUNTER — Encounter: Payer: Self-pay | Admitting: Family Medicine

## 2019-04-27 ENCOUNTER — Ambulatory Visit: Payer: 59 | Admitting: Family Medicine

## 2019-04-27 ENCOUNTER — Other Ambulatory Visit: Payer: Self-pay | Admitting: Family Medicine

## 2019-04-27 VITALS — BP 120/72 | HR 75 | Temp 96.7°F | Ht 66.0 in | Wt 232.0 lb

## 2019-04-27 DIAGNOSIS — E785 Hyperlipidemia, unspecified: Secondary | ICD-10-CM | POA: Insufficient documentation

## 2019-04-27 DIAGNOSIS — E119 Type 2 diabetes mellitus without complications: Secondary | ICD-10-CM | POA: Diagnosis not present

## 2019-04-27 LAB — HEMOGLOBIN A1C: Hgb A1c MFr Bld: 6.4 % (ref 4.6–6.5)

## 2019-04-27 MED FILL — ROSUVASTATIN CALCIUM 20 MG: 20 | 90 days supply | Qty: 90 | Fill #0

## 2019-04-27 NOTE — Patient Instructions (Signed)
Goal weight in July: 215-220 lbs  Keep the diet clean and stay active.  Give [place] 2-3 business days to get the results of your labs back.   Let us know if you need anything.

## 2019-04-27 NOTE — Progress Notes (Signed)
Subjective:   Chief Complaint  Patient presents with  . Follow-up    Ellen Mills is a 49 y.o. female here for follow-up of diabetes.   Ellen Mills does not routinely monitor sugars Patient does not require insulin.   Medications include: metformin 1000 mg bid Exercise: sometimes will walk Diet good outside of holidays.  Hyperlipidemia Patient presents for dyslipidemia follow up. Currently being treated with Crestor 20 mg/d and compliance with treatment thus far has been good. She denies myalgias. Diet and exercies as above.  The patient is not known to have coexisting coronary artery disease.  Past Medical History:  Diagnosis Date  . Anemia   . Diabetes (HCC)   . Hyperlipidemia   . Obesity      Related testing: Date of retinal exam: Done Pneumovax: done Flu Shot: done  Review of Systems: Pulmonary:  No SOB Cardiovascular:  No chest pain  Objective:  BP 120/72 (BP Location: Left Arm, Patient Position: Sitting, Cuff Size: Large)   Pulse 75   Temp (!) 96.7 F (35.9 C) (Temporal)   Ht 5\' 6"  (1.676 m)   Wt 232 lb (105.2 kg)   SpO2 98%   BMI 37.45 kg/m  General:  Well developed, well nourished, in no apparent distress Skin:  Warm, no pallor or diaphoresis Head:  Normocephalic, atraumatic Eyes:  Pupils equal and round, sclera anicteric without injection  Lungs:  CTAB, no access msc use Cardio:  RRR, no bruits, no LE edema Musculoskeletal:  Symmetrical muscle groups noted without atrophy or deformity Neuro:  Sensation intact to pinprick on feet Psych: Age appropriate judgment and insight  Assessment:   Type 2 diabetes mellitus without complication, without long-term current use of insulin (HCC) - Plan: HgB A1c, HM Diabetes Foot Exam  Hyperlipidemia, unspecified hyperlipidemia type   Plan:   Orders as above. Counseled on diet and exercise. F/u in 6 mo. The patient voiced understanding and agreement to the plan.  Levan,  DO 04/27/19 7:14 AM

## 2019-06-10 ENCOUNTER — Other Ambulatory Visit: Payer: Self-pay | Admitting: Obstetrics and Gynecology

## 2019-06-10 DIAGNOSIS — Z1231 Encounter for screening mammogram for malignant neoplasm of breast: Secondary | ICD-10-CM

## 2019-06-22 ENCOUNTER — Other Ambulatory Visit: Payer: Self-pay | Admitting: Family Medicine

## 2019-06-22 DIAGNOSIS — E119 Type 2 diabetes mellitus without complications: Secondary | ICD-10-CM

## 2019-06-22 MED FILL — metFORMIN HCL 500 MG TABS: 500 | 30 days supply | Qty: 120 | Fill #0

## 2019-07-22 ENCOUNTER — Other Ambulatory Visit: Payer: Self-pay | Admitting: Family Medicine

## 2019-07-22 DIAGNOSIS — E119 Type 2 diabetes mellitus without complications: Secondary | ICD-10-CM

## 2019-07-22 DIAGNOSIS — E785 Hyperlipidemia, unspecified: Secondary | ICD-10-CM

## 2019-07-22 MED FILL — ROSUVASTATIN CALCIUM 20 MG: 20 | 90 days supply | Qty: 90 | Fill #0

## 2019-08-03 ENCOUNTER — Encounter: Payer: Self-pay | Admitting: Family Medicine

## 2019-08-03 ENCOUNTER — Ambulatory Visit (INDEPENDENT_AMBULATORY_CARE_PROVIDER_SITE_OTHER): Payer: 59

## 2019-08-03 ENCOUNTER — Ambulatory Visit: Payer: 59 | Admitting: Family Medicine

## 2019-08-03 ENCOUNTER — Other Ambulatory Visit: Payer: Self-pay

## 2019-08-03 VITALS — BP 112/74 | HR 95 | Ht 66.0 in | Wt 236.0 lb

## 2019-08-03 DIAGNOSIS — M545 Low back pain, unspecified: Secondary | ICD-10-CM

## 2019-08-03 DIAGNOSIS — M25552 Pain in left hip: Secondary | ICD-10-CM | POA: Diagnosis not present

## 2019-08-03 DIAGNOSIS — M7062 Trochanteric bursitis, left hip: Secondary | ICD-10-CM | POA: Diagnosis not present

## 2019-08-03 DIAGNOSIS — M1612 Unilateral primary osteoarthritis, left hip: Secondary | ICD-10-CM | POA: Diagnosis not present

## 2019-08-03 DIAGNOSIS — M4807 Spinal stenosis, lumbosacral region: Secondary | ICD-10-CM | POA: Diagnosis not present

## 2019-08-03 MED ORDER — MELOXICAM 15 MG PO TABS: 15.0000 mg | ORAL_TABLET | Freq: Every day | ORAL | 0 refills | Status: DC

## 2019-08-03 MED FILL — MELOXICAM 15 MG TABLET: 15 | 30 days supply | Qty: 30 | Fill #0

## 2019-08-03 NOTE — Progress Notes (Signed)
[facility] [address] [place] [address] Phone: [phone] Subjective:   Bruce Donath, am serving as a scribe for Dr. Antoine Primas. This visit occurred during the SARS-CoV-2 public health emergency.  Safety protocols were in place, including screening questions prior to the visit, additional usage of staff PPE, and extensive cleaning of exam room while observing appropriate contact time as indicated for disinfecting solutions.   I'm seeing this patient by the request  of:  Sharlene Dory, DO  CC: Left-sided hip pain  [id number]  Ellen Mills is a 49 y.o. female coming in with complaint of left hip pain in the glute for one month. Was moving a Child psychotherapist and was moving with her left hip. Pain radiates into the lower back. Taking Tylenol for Arthritis.  Patient states that it seems to stay localized.  Worse at night.  Worse after sitting for short amount of time.  Patient denies any radiation of the pain.  Rates the severity of pain is 5 out of 10.     Past Medical History:  Diagnosis Date  . Anemia   . Diabetes (HCC)   . Hyperlipidemia   . Obesity    Past Surgical History:  Procedure Laterality Date  . ABDOMINAL HYSTERECTOMY    . LEEP     Social History   Socioeconomic History  . Marital status: Single    Spouse name: Not on file  . Number of children: 3  . Years of education: Not on file  . Highest education level: Not on file  Occupational History  . Occupation: Surveyor, minerals  Tobacco Use  . Smoking status: Never Smoker  . Smokeless tobacco: Current User  Substance and Sexual Activity  . Alcohol use: No  . Drug use: No  . Sexual activity: Yes    Partners: Male    Birth control/protection: Surgical  Other Topics Concern  . Not on file  Social History Narrative  . Not on file   Social Determinants of Health   Financial Resource Strain:   . Difficulty of Paying Living Expenses:   Food  Insecurity:   . Worried About Programme researcher, broadcasting/film/video in the Last Year:   . Barista in the Last Year:   Transportation Needs:   . Freight forwarder (Medical):   Marland Kitchen Lack of Transportation (Non-Medical):   Physical Activity:   . Days of Exercise per Week:   . Minutes of Exercise per Session:   Stress:   . Feeling of Stress :   Social Connections:   . Frequency of Communication with Friends and Family:   . Frequency of Social Gatherings with Friends and Family:   . Attends Religious Services:   . Active Member of Clubs or Organizations:   . Attends Banker Meetings:   Marland Kitchen Marital Status:    No Known Allergies Family History  Problem Relation Age of Onset  . Cancer Mother        lung cancer  . Hyperlipidemia Mother   . Depression Mother   . Anxiety disorder Mother   . Depression Father   . Hypertension Father     Current Outpatient Medications (Endocrine & Metabolic):  .  metFORMIN (GLUCOPHAGE) 500 MG tablet, TAKE 2 TABLETS BY MOUTH 2 TIMES A DAY  Current Outpatient Medications (Cardiovascular):  .  rosuvastatin (CRESTOR) 20 MG tablet, TAKE 1 TABLET BY MOUTH DAILY.  Current Outpatient Medications (Respiratory):  .  fluticasone (  FLONASE) 50 MCG/ACT nasal spray, Place 2 sprays into both nostrils daily.  Current Outpatient Medications (Analgesics):  .  aspirin EC 81 MG tablet, Take 81 mg by mouth daily. .  meloxicam (MOBIC) 15 MG tablet, Take 1 tablet (15 mg total) by mouth daily.   Current Outpatient Medications (Other):  .  glucose blood test strip, Use three times per week to check blood sugar.  DXE11.9 .  ONETOUCH DELICA LANCETS 53G MISC, Use as directed 3 times a week to check blood sugar.  DX E11.9 .  Vitamin D, Ergocalciferol, (DRISDOL) 50000 units CAPS capsule, Take 1 capsule (50,000 Units total) by mouth every 7 (seven) days.   Reviewed prior external information including notes and imaging from  primary care provider As well as notes that  were available from care everywhere and other healthcare systems.  Past medical history, social, surgical and family history all reviewed in electronic medical record.  No pertanent information unless stated regarding to the chief complaint.   Review of Systems:  No headache, visual changes, nausea, vomiting, diarrhea, constipation, dizziness, abdominal pain, skin rash, fevers, chills, night sweats, weight loss, swollen lymph nodes, body aches, joint swelling, chest pain, shortness of breath, mood changes. POSITIVE muscle aches  Objective  Blood pressure 112/74, pulse 95, height 5\' 6"  (1.676 m), weight 236 lb (107 kg), SpO2 99 %.   General: No apparent distress alert and oriented x3 mood and affect normal, dressed appropriately.  HEENT: Pupils equal, extraocular movements intact  Respiratory: Patient's speak in full sentences and does not appear short of breath  Cardiovascular: No lower extremity edema, non tender, no erythema  Neuro: Cranial nerves II through XII are intact, neurovascularly intact in all extremities with 2+ DTRs and 2+ pulses.  Gait normal with good balance and coordination.  MSK:  Non tender with full range of motion and good stability and symmetric strength and tone of shoulders, elbows, wrist,  knee and ankles bilaterally.  Left hip exam shows the patient does have a positive Corky Sox.  Negative straight leg test.  Tender to palpation over the greater trochanteric area and minor over the lateral gluteal tendon.  Patient has mild pain over the left sacroiliac joint mild tightness in the paraspinal musculature of the lumbar spine  [id number]; 15 additional minutes spent for Therapeutic exercises as stated in above notes.  This included exercises focusing on stretching, strengthening, with significant focus on eccentric aspects.   Long term goals include an improvement in range of motion, strength, endurance as well as avoiding reinjury. Patient's frequency would include in 1-2 times a  day, 3-5 times a week for a duration of 6-12 weeks. Hip strengthening exercises which included:  Pelvic tilt/bracing to help with proper recruitment of the lower abs and pelvic floor muscles  Glute strengthening to properly contract glutes without over-engaging low back and hamstrings - prone hip extension and glute bridge exercises Proper stretching techniques to increase effectiveness for the hip flexors, groin, quads, piriformic and low back when appropriate    Proper technique shown and discussed handout in great detail with ATC.  All questions were discussed and answered.     Impression and Recommendations:     This case required medical decision making of moderate complexity. The above documentation has been reviewed and is accurate and complete Lyndal Pulley, DO       Note: This dictation was prepared with Dragon dictation along with smaller phrase technology. Any transcriptional errors that result from this process are unintentional.

## 2019-08-03 NOTE — Patient Instructions (Addendum)
Xray today Pen[place]aid Meloxicam See m in 5 weeks

## 2019-08-03 NOTE — Assessment & Plan Note (Signed)
Greater trochanteric bursitis is likely some mild gluteal tendinitis.  Discussed with patient about icing regimen, topical anti-inflammatories, short course of oral anti-inflammatories given.  Work with Event organiser to learn home exercises.  Worsening symptoms when patient follows up in 5 to 6 weeks will consider injection and possible formal physical therapy.

## 2019-08-18 ENCOUNTER — Other Ambulatory Visit: Payer: Self-pay | Admitting: Family Medicine

## 2019-08-18 DIAGNOSIS — E119 Type 2 diabetes mellitus without complications: Secondary | ICD-10-CM

## 2019-09-10 ENCOUNTER — Other Ambulatory Visit: Payer: Self-pay

## 2019-09-10 ENCOUNTER — Encounter: Payer: Self-pay | Admitting: Family Medicine

## 2019-09-10 ENCOUNTER — Ambulatory Visit: Payer: Self-pay

## 2019-09-10 ENCOUNTER — Ambulatory Visit: Payer: 59 | Admitting: Family Medicine

## 2019-09-10 ENCOUNTER — Other Ambulatory Visit: Payer: Self-pay | Admitting: Family Medicine

## 2019-09-10 VITALS — BP 120/90 | HR 96 | Ht 66.0 in | Wt 238.0 lb

## 2019-09-10 DIAGNOSIS — M25552 Pain in left hip: Secondary | ICD-10-CM

## 2019-09-10 DIAGNOSIS — M7062 Trochanteric bursitis, left hip: Secondary | ICD-10-CM

## 2019-09-10 MED ORDER — MELOXICAM 15 MG PO TABS
15.0000 mg | ORAL_TABLET | Freq: Every day | ORAL | 0 refills | Status: DC
Start: 2019-09-10 — End: 2020-01-13

## 2019-09-10 MED ORDER — GABAPENTIN 100 MG PO CAPS
200.0000 mg | ORAL_CAPSULE | Freq: Every day | ORAL | 3 refills | Status: DC
Start: 2019-09-10 — End: 2019-09-10

## 2019-09-10 MED FILL — GABAPENTIN 100 MG CAPSULE: 100 | 90 days supply | Qty: 180 | Fill #0

## 2019-09-10 MED FILL — MELOXICAM 15 MG TABLET: 15 | 30 days supply | Qty: 30 | Fill #0

## 2019-09-10 NOTE — Progress Notes (Signed)
[facility] [address] [place] [address] Phone: [phone] Subjective:   I Ellen Mills am serving as a Neurosurgeon for Dr. Antoine Primas.  This visit occurred during the SARS-CoV-2 public health emergency.  Safety protocols were in place, including screening questions prior to the visit, additional usage of staff PPE, and extensive cleaning of exam room while observing appropriate contact time as indicated for disinfecting solutions.   I'm seeing this patient by the request  of:  Sharlene Dory, DO  CC: Left hip pain  ION:[id number]   08/03/2019 Greater trochanteric bursitis is likely some mild gluteal tendinitis.  Discussed with patient about icing regimen, topical anti-inflammatories, short course of oral anti-inflammatories given.  Work with Event organiser to learn home exercises.  Worsening symptoms when patient follows up in 5 to 6 weeks will consider injection and possible formal physical therapy.  Update 09/10/2019 Ellen Mills is a 49 y.o. female coming in with complaint of left hip pain. Patient states took a 4 mile walk. After the walk she believes the hip was irritated. Medication is working but would like to increase MG.   Medications patient has been prescribed: Meloxicam which has been helping          Reviewed prior external information including notes and imaging from previsou exam, outside providers and external EMR if available.   As well as notes that were available from care everywhere and other healthcare systems.  Past medical history, social, surgical and family history all reviewed in electronic medical record.  No pertanent information unless stated regarding to the chief complaint.   Past Medical History:  Diagnosis Date  . Anemia   . Diabetes (HCC)   . Hyperlipidemia   . Obesity     No Known Allergies   Review of Systems:  No headache, visual changes, nausea, vomiting, diarrhea,  constipation, dizziness, abdominal pain, skin rash, fevers, chills, night sweats, weight loss, swollen lymph nodes, body aches, joint swelling, chest pain, shortness of breath, mood changes. POSITIVE muscle aches  Objective  There were no vitals taken for this visit.   General: No apparent distress alert and oriented x3 mood and affect normal, dressed appropriately.  HEENT: Pupils equal, extraocular movements intact  Respiratory: Patient's speak in full sentences and does not appear short of breath  Cardiovascular: No lower extremity edema, non tender, no erythema  Neuro: Cranial nerves II through XII are intact, neurovascularly intact in all extremities with 2+ DTRs and 2+ pulses.  Gait normal with good balance and coordination.  MSK:  Non tender with full range of motion and good stability and symmetric strength and tone of shoulders, elbows, wrist, hip, knee and ankles bilaterally.  Back - Normal skin, Spine with normal alignment and no deformity.  No tenderness to vertebral process palpation.  Paraspinous muscles are not tender and without spasm.   Range of motion is full at neck and lumbar sacral regions  Left hip exam shows the patient does have tender to palpation over the greater trochanteric area.  Positive FABER test.  Negative straight leg test with mild pain over the left sacroiliac joint     Procedure: Real-time Ultrasound Guided Injection of left  greater trochanteric bursitis secondary to patient's body habitus Device: GE Logiq Q7  Ultrasound guided injection is preferred based studies that show increased duration, increased effect, greater accuracy, decreased procedural pain, increased response rate, and decreased cost with ultrasound guided versus blind injection.  Verbal informed consent obtained.  Time-out conducted.  Noted no overlying erythema, induration, or other signs of local infection.  Skin prepped in a sterile fashion.  Local anesthesia: Topical Ethyl chloride.    With sterile technique and under real time ultrasound guidance:  Greater trochanteric area was visualized and patient's bursa was noted. A 22-gauge 3 inch needle was inserted and 4 cc of 0.5% Marcaine and 1 cc of Kenalog 40 mg/dL was injected. Pictures taken Completed without difficulty  Pain immediately resolved suggesting accurate placement of the medication.  Advised to call if fevers/chills, erythema, induration, drainage, or persistent bleeding.  Images permanently stored and available for review in the ultrasound unit.  Impression: Technically successful ultrasound guided injection.    Assessment and Plan:        The above documentation has been reviewed and is accurate and complete Lyndal Pulley, DO       Note: This dictation was prepared with Dragon dictation along with smaller phrase technology. Any transcriptional errors that result from this process are unintentional.

## 2019-09-10 NOTE — Patient Instructions (Addendum)
Good to see you Refilled meloxicam  Gabapentin 200 mg at night Injected GT bursitis today See me again in 6 weeks

## 2019-09-10 NOTE — Assessment & Plan Note (Signed)
Injection given today.  Started on gabapentin for the exacerbation.  Refilled patient's meloxicam.  Discussed home exercises.  Patient declined formal physical therapy for now.  Follow-up again in 4 to 6 weeks

## 2019-10-13 ENCOUNTER — Other Ambulatory Visit: Payer: Self-pay

## 2019-10-13 ENCOUNTER — Ambulatory Visit
Admission: RE | Admit: 2019-10-13 | Discharge: 2019-10-13 | Disposition: A | Discharge status: Home / Self Care | Payer: 59 | Source: Ambulatory Visit | Attending: Obstetrics and Gynecology | Admitting: Obstetrics and Gynecology

## 2019-10-13 ENCOUNTER — Encounter: Payer: Self-pay | Admitting: Family Medicine

## 2019-10-13 ENCOUNTER — Ambulatory Visit (INDEPENDENT_AMBULATORY_CARE_PROVIDER_SITE_OTHER): Payer: 59 | Admitting: Family Medicine

## 2019-10-13 VITALS — BP 120/78 | HR 95 | Temp 98.4°F | Ht 66.0 in | Wt 230.2 lb

## 2019-10-13 DIAGNOSIS — Z0000 Encounter for general adult medical examination without abnormal findings: Secondary | ICD-10-CM | POA: Diagnosis not present

## 2019-10-13 DIAGNOSIS — E119 Type 2 diabetes mellitus without complications: Secondary | ICD-10-CM | POA: Diagnosis not present

## 2019-10-13 DIAGNOSIS — Z6837 Body mass index (BMI) 37.0-37.9, adult: Secondary | ICD-10-CM | POA: Diagnosis not present

## 2019-10-13 DIAGNOSIS — Z1231 Encounter for screening mammogram for malignant neoplasm of breast: Secondary | ICD-10-CM

## 2019-10-13 DIAGNOSIS — Z1151 Encounter for screening for human papillomavirus (HPV): Secondary | ICD-10-CM | POA: Diagnosis not present

## 2019-10-13 DIAGNOSIS — Z1211 Encounter for screening for malignant neoplasm of colon: Secondary | ICD-10-CM | POA: Diagnosis not present

## 2019-10-13 DIAGNOSIS — Z1159 Encounter for screening for other viral diseases: Secondary | ICD-10-CM

## 2019-10-13 DIAGNOSIS — Z01419 Encounter for gynecological examination (general) (routine) without abnormal findings: Secondary | ICD-10-CM | POA: Diagnosis not present

## 2019-10-13 LAB — CBC
HCT: 39 % (ref 36.0–46.0)
Hemoglobin: 12.9 g/dL (ref 12.0–15.0)
MCHC: 33.1 g/dL (ref 30.0–36.0)
MCV: 87.9 fl (ref 78.0–100.0)
Platelets: 345 10*3/uL (ref 150.0–400.0)
RBC: 4.44 Mil/uL (ref 3.87–5.11)
RDW: 13.9 % (ref 11.5–15.5)
WBC: 7 10*3/uL (ref 4.0–10.5)

## 2019-10-13 LAB — LIPID PANEL
Cholesterol: 129 mg/dL (ref 0–200)
HDL: 40.8 mg/dL (ref >39.00)
LDL Cholesterol: 67 mg/dL (ref 0–99)
NonHDL: 88.15
Total CHOL/HDL Ratio: 3
Triglycerides: 105 mg/dL (ref 0.0–149.0)
VLDL: 21 mg/dL (ref 0.0–40.0)

## 2019-10-13 LAB — COMPREHENSIVE METABOLIC PANEL
ALT: 11 U/L (ref 0–35)
AST: 5 U/L (ref 0–37)
Albumin: 4.5 g/dL (ref 3.5–5.2)
Alkaline Phosphatase: 57 U/L (ref 39–117)
BUN: 10 mg/dL (ref 6–23)
CO2: 30 mEq/L (ref 19–32)
Calcium: 9.8 mg/dL (ref 8.4–10.5)
Chloride: 103 mEq/L (ref 96–112)
Creatinine, Ser: 0.89 mg/dL (ref 0.40–1.20)
GFR: 81.29 mL/min (ref >60.00)
Glucose, Bld: 111 mg/dL — ABNORMAL HIGH (ref 70–99)
Potassium: 4.4 mEq/L (ref 3.5–5.1)
Sodium: 142 mEq/L (ref 135–145)
Total Bilirubin: 0.5 mg/dL (ref 0.2–1.2)
Total Protein: 7.6 g/dL (ref 6.0–8.3)

## 2019-10-13 LAB — HEMOGLOBIN A1C: Hgb A1c MFr Bld: 6.5 % (ref 4.6–6.5)

## 2019-10-13 LAB — MICROALBUMIN / CREATININE URINE RATIO
Creatinine,U: 222.7 mg/dL
Microalb Creat Ratio: 0.5 mg/g (ref 0.0–30.0)
Microalb, Ur: 1 mg/dL (ref 0.0–1.9)

## 2019-10-13 MED ORDER — OZEMPIC (0.25 OR 0.5 MG/DOSE) 2 MG/1.5ML ~~LOC~~ SOPN
PEN_INJECTOR | SUBCUTANEOUS | 1 refills | Status: AC
Start: 2019-10-13 — End: 2019-11-17

## 2019-10-13 MED FILL — VENLAFAXINE HCL 75 MG TAB: 75 | 30 days supply | Qty: 30 | Fill #0

## 2019-10-13 MED FILL — OZEMPIC 0.25 OR 0.5 MG/DOSE: 2 | 56 days supply | Qty: 2 | Fill #0

## 2019-10-13 NOTE — Progress Notes (Signed)
Chief Complaint  Patient presents with  . Annual Exam     Well Woman Ellen Mills is here for a complete physical.   Her last physical was >1 year ago.  Current diet: in general, diet could be better. Current exercise: walking. Weight is stable and she denies fatigue. Seatbelt? Yes  Health Maintenance Pap/HPV- N/A, has had hysterectomy Mammogram- Scheduled today Tetanus- Yes Hep C screening- No HIV screening- Yes  Past Medical History:  Diagnosis Date  . Anemia   . Diabetes (HCC)   . Hyperlipidemia   . Obesity      Past Surgical History:  Procedure Laterality Date  . ABDOMINAL HYSTERECTOMY    . LEEP      Medications  Current Outpatient Medications on File Prior to Visit  Medication Sig Dispense Refill  . aspirin EC 81 MG tablet Take 81 mg by mouth daily.    . fluticasone (FLONASE) 50 MCG/ACT nasal spray Place 2 sprays into both nostrils daily. 16 g 6  . gabapentin (NEURONTIN) 100 MG capsule Take 2 capsules (200 mg total) by mouth at bedtime. 180 capsule 3  . glucose blood test strip Use three times per week to check blood sugar.  DXE11.9 100 each 3  . meloxicam (MOBIC) 15 MG tablet Take 1 tablet (15 mg total) by mouth daily. 30 tablet 0  . meloxicam (MOBIC) 15 MG tablet Take 1 tablet (15 mg total) by mouth daily. 30 tablet 0  . metFORMIN (GLUCOPHAGE) 500 MG tablet TAKE 2 TABLETS BY MOUTH 2 TIMES A DAY 120 tablet 0  . ONETOUCH DELICA LANCETS 33G MISC Use as directed 3 times a week to check blood sugar.  DX E11.9 100 each 3  . rosuvastatin (CRESTOR) 20 MG tablet TAKE 1 TABLET BY MOUTH DAILY. 90 tablet 0   Allergies No Known Allergies  Review of Systems: Constitutional:  no unexpected weight changes Eye:  no recent significant change in vision Ear/Nose/Mouth/Throat:  Ears:  no recent change in hearing Nose/Mouth/Throat:  no complaints of nasal congestion, no sore throat Cardiovascular: no chest pain Respiratory:  no shortness of breath Gastrointestinal:   no abdominal pain, no change in bowel habits GU:  Female: negative for dysuria or pelvic pain Musculoskeletal/Extremities:  no new pain of the joints Integumentary (Skin/Breast):  no abnormal skin lesions reported Neurologic:  no headaches Endocrine:  denies fatigue Hematologic/Lymphatic:  No areas of easy bleeding  Exam BP 120/78 (BP Location: Left Arm, Patient Position: Sitting, Cuff Size: Large)   Pulse 95   Temp 98.4 F (36.9 C) (Oral)   Ht 5\' 6"  (1.676 m)   Wt 230 lb 4 oz (104.4 kg)   SpO2 95%   BMI 37.16 kg/m  General:  well developed, well nourished, in no apparent distress Skin:  no significant moles, warts, or growths Head:  no masses, lesions, or tenderness Eyes:  pupils equal and round, sclera anicteric without injection Ears:  canals without lesions, TMs shiny without retraction, no obvious effusion, no erythema Nose:  nares patent, septum midline, mucosa normal, and no drainage or sinus tenderness Throat/Pharynx:  lips and gingiva without lesion; tongue and uvula midline; non-inflamed pharynx; no exudates or postnasal drainage Neck: neck supple without adenopathy, thyromegaly, or masses Lungs:  clear to auscultation, breath sounds equal bilaterally, no respiratory distress Cardio:  regular rate and rhythm, no LE edema Abdomen:  abdomen soft, nontender; bowel sounds normal; no masses or organomegaly Genital: Defer to GYN Musculoskeletal:  symmetrical muscle groups noted without atrophy or  deformity Extremities:  no clubbing, cyanosis, or edema, no deformities, no skin discoloration Neuro:  gait normal; deep tendon reflexes normal and symmetric Psych: well oriented with normal range of affect and appropriate judgment/insight  Assessment and Plan  Well adult exam - Plan: CBC, Comprehensive metabolic panel, Lipid panel  Type 2 diabetes mellitus without complication, without long-term current use of insulin (HCC) - Plan: Hemoglobin A1c, Microalbumin / creatinine urine  ratio  Encounter for hepatitis C screening test for low risk patient - Plan: Hepatitis C antibody  Screen for colon cancer - Plan: Ambulatory referral to Gastroenterology   Well 49 y.o. female. Counseled on diet and exercise. Shared decision making performed regarding mammograms for breast cancer screening and limitations in younger females.  GI referral for CCS after 49 per her request.  Shingrix info given for when she turns 49.  Other orders as above. Follow up in 6 mo or prn. The patient voiced understanding and agreement to the plan.  Jilda Roche Allen, DO 10/13/19 8:40 AM

## 2019-10-13 NOTE — Patient Instructions (Addendum)
Give [place] 2-3 business days to get the results of your labs back.   Keep the diet clean and stay active.  OK to use Debrox (peroxide) in the ear to loosen up wax. Also recommend using a bulb syringe (for removing boogers from baby's noses) to flush through warm water. Do not use Q-tips as this can impact wax further.  We are temporarily stopping your metformin in favor of a weekly shot that is designed to help with weight loss, but is also used to lower A1c. Let me know if there are cost issues.   If you do not hear anything about your referral in the next 1-2 weeks, call our office and ask for an update.  After you turn 50: The new Shingrix vaccine (for shingles) is a 2 shot series. It can make people feel low energy, achy and almost like they have the flu for 48 hours after injection. Please plan accordingly when deciding on when to get this shot. Call our office for a nurse visit appointment to get this. The second shot of the series is less severe regarding the side effects, but it still lasts 48 hours.   Let us know if you need anything.

## 2019-10-14 LAB — HEPATITIS C ANTIBODY
Hepatitis C Ab: NONREACTIVE
SIGNAL TO CUT-OFF: 0.01 (ref <1.00)

## 2019-10-19 ENCOUNTER — Other Ambulatory Visit: Payer: Self-pay | Admitting: Family Medicine

## 2019-10-19 MED ORDER — ONDANSETRON 4 MG PO TBDP
4.0000 mg | ORAL_TABLET | Freq: Three times a day (TID) | ORAL | 0 refills | Status: DC | PRN
Start: 2019-10-19 — End: 2019-12-23

## 2019-10-19 MED FILL — ONDANSETRON ODT 4 MG TABLET: 4 | 7 days supply | Qty: 20 | Fill #0

## 2019-10-20 ENCOUNTER — Other Ambulatory Visit: Payer: Self-pay | Admitting: Family Medicine

## 2019-10-20 DIAGNOSIS — E785 Hyperlipidemia, unspecified: Secondary | ICD-10-CM

## 2019-10-20 DIAGNOSIS — E119 Type 2 diabetes mellitus without complications: Secondary | ICD-10-CM

## 2019-10-20 MED FILL — ROSUVASTATIN CALCIUM 20 MG: 20 | 90 days supply | Qty: 90 | Fill #0

## 2019-10-29 ENCOUNTER — Encounter: Payer: Self-pay | Admitting: Family Medicine

## 2019-10-29 ENCOUNTER — Other Ambulatory Visit: Payer: Self-pay

## 2019-10-29 ENCOUNTER — Ambulatory Visit: Payer: 59 | Admitting: Family Medicine

## 2019-10-29 DIAGNOSIS — M7062 Trochanteric bursitis, left hip: Secondary | ICD-10-CM

## 2019-10-29 DIAGNOSIS — M999 Biomechanical lesion, unspecified: Secondary | ICD-10-CM | POA: Diagnosis not present

## 2019-10-29 NOTE — Assessment & Plan Note (Signed)
   Decision today to treat with OMT was based on Physical Exam  After verbal consent patient was treated with HVLA, ME, FPR techniques in lumbar and sacral areas, all areas are chronic   Patient tolerated the procedure well with improvement in symptoms  Patient given exercises, stretches and lifestyle modifications including the meloxicam and gabapentin  See medications in patient instructions if given  Patient will follow up in 4-8 weeks

## 2019-10-29 NOTE — Patient Instructions (Signed)
Much better overall  Tried manipulation  See me when you need me

## 2019-10-29 NOTE — Progress Notes (Signed)
[facility] [address] [place] [address] Phone: [phone] Subjective:   Ellen Mills, am serving as a scribe for Dr. Antoine Primas.  This visit occurred during the SARS-CoV-2 public health emergency.  Safety protocols were in place, including screening questions prior to the visit, additional usage of staff PPE, and extensive cleaning of exam room while observing appropriate contact time as indicated for disinfecting solutions.   I'm seeing this patient by the request  of:  Sharlene Dory, DO  CC: Left hip pain follow-up  PPJ:[id number]  09/10/2019 Left hip exam shows the patient does have tender to palpation over the greater trochanteric area.  Positive FABER test.  Negative straight leg test with mild pain over the left sacroiliac joint Injection given today.  Started on gabapentin for the exacerbation.  Refilled patient's meloxicam.  Discussed home exercises.    Update: 10/29/2019 States hip is doing a lot better has no complaints states the medication is helping  Patient states 90 to 95% better.  The patient states that it is waking her up at night.  Seems to be doing relatively well overall.  Does have some tightness of the lumbar spine     Patient had undergone an injection from September 10, 2019.  Past Medical History:  Diagnosis Date  . Anemia   . Diabetes (HCC)   . Hyperlipidemia   . Obesity    Past Surgical History:  Procedure Laterality Date  . ABDOMINAL HYSTERECTOMY    . LEEP     Social History   Socioeconomic History  . Marital status: Single    Spouse name: Not on file  . Number of children: 3  . Years of education: Not on file  . Highest education level: Not on file  Occupational History  . Occupation: Surveyor, minerals  Tobacco Use  . Smoking status: Never Smoker  . Smokeless tobacco: Current User  Vaping Use  . Vaping Use: Never used  Substance and Sexual Activity  . Alcohol use: No  . Drug  use: No  . Sexual activity: Yes    Partners: Male    Birth control/protection: Surgical  Other Topics Concern  . Not on file  Social History Narrative  . Not on file   Social Determinants of Health   Financial Resource Strain:   . Difficulty of Paying Living Expenses:   Food Insecurity:   . Worried About Programme researcher, broadcasting/film/video in the Last Year:   . Barista in the Last Year:   Transportation Needs:   . Freight forwarder (Medical):   Marland Kitchen Lack of Transportation (Non-Medical):   Physical Activity:   . Days of Exercise per Week:   . Minutes of Exercise per Session:   Stress:   . Feeling of Stress :   Social Connections:   . Frequency of Communication with Friends and Family:   . Frequency of Social Gatherings with Friends and Family:   . Attends Religious Services:   . Active Member of Clubs or Organizations:   . Attends Banker Meetings:   Marland Kitchen Marital Status:    No Known Allergies Family History  Problem Relation Age of Onset  . Cancer Mother        lung cancer  . Hyperlipidemia Mother   . Depression Mother   . Anxiety disorder Mother   . Depression Father   . Hypertension Father     Current Outpatient Medications (Endocrine & Metabolic):  .  Semaglutide,0.25 or 0.5MG /DOS, (OZEMPIC, 0.25 OR 0.5 MG/DOSE,) 2 MG/1.5ML SOPN, Inject 0.1875 mLs (0.25 mg total) into the skin once a week for 7 days, THEN 0.375 mLs (0.5 mg total) once a week for 28 days.  Current Outpatient Medications (Cardiovascular):  .  rosuvastatin (CRESTOR) 20 MG tablet, TAKE 1 TABLET BY MOUTH DAILY.  Current Outpatient Medications (Respiratory):  .  fluticasone (FLONASE) 50 MCG/ACT nasal spray, Place 2 sprays into both nostrils daily.  Current Outpatient Medications (Analgesics):  .  aspirin EC 81 MG tablet, Take 81 mg by mouth daily. .  meloxicam (MOBIC) 15 MG tablet, Take 1 tablet (15 mg total) by mouth daily.   Current Outpatient Medications (Other):  .  gabapentin  (NEURONTIN) 100 MG capsule, Take 2 capsules (200 mg total) by mouth at bedtime. Marland Kitchen  glucose blood test strip, Use three times per week to check blood sugar.  DXE11.9 .  ondansetron (ZOFRAN-ODT) 4 MG disintegrating tablet, Take 1 tablet (4 mg total) by mouth every 8 (eight) hours as needed for nausea or vomiting. [name] DELICA LANCETS 33G MISC, Use as directed 3 times a week to check blood sugar.  DX E11.9   Reviewed prior external information including notes and imaging from  primary care provider As well as notes that were available from care everywhere and other healthcare systems.  Past medical history, social, surgical and family history all reviewed in electronic medical record.  No pertanent information unless stated regarding to the chief complaint.   Review of Systems:  No headache, visual changes, nausea, vomiting, diarrhea, constipation, dizziness, abdominal pain, skin rash, fevers, chills, night sweats, weight loss, swollen lymph nodes, body aches, joint swelling, chest pain, shortness of breath, mood changes. POSITIVE muscle aches  Objective  Blood pressure 130/82, pulse 94, height 5\' 6"  (1.676 m), weight 232 lb (105.2 kg), SpO2 98 %.   General: No apparent distress alert and oriented x3 mood and affect normal, dressed appropriately.  HEENT: Pupils equal, extraocular movements intact  Respiratory: Patient's speak in full sentences and does not appear short of breath  Cardiovascular: No lower extremity edema, non tender, no erythema  Neuro: Cranial nerves II through XII are intact, neurovascularly intact in all extremities with 2+ DTRs and 2+ pulses.  Gait normal with good balance and coordination.  MSK: Left hip exam very mild tenderness over the greater trochanteric area. Patient's low back does have some tightness around the left sacroiliac joint.  Mild tightness with FABER test.  Osteopathic findings L2 flexed rotated and side bent left Sacrum left on left      Impression and Recommendations:     The above documentation has been reviewed and is accurate and complete , DO       Note: This dictation was prepared with Dragon dictation along with smaller phrase technology. Any transcriptional errors that result from this process are unintentional.

## 2019-10-29 NOTE — Assessment & Plan Note (Signed)
Significant improvement at this time.  Clinically relatively well.  Did respond fairly well to very mild osteopathic manipulation of the lumbar spine today.  Continue the gabapentin if it is helpful and follow-up with me more on an as-needed basis

## 2019-11-20 MED ORDER — OZEMPIC (1 MG/DOSE) 2 MG/1.5ML ~~LOC~~ SOPN: 1.0000 mg | PEN_INJECTOR | SUBCUTANEOUS | 2 refills | Status: DC

## 2019-11-20 MED FILL — OZEMPIC (1 MG/DOSE) 4 MG/3M: 4 | 84 days supply | Qty: 9 | Fill #0

## 2019-12-05 ENCOUNTER — Other Ambulatory Visit: Payer: Self-pay

## 2019-12-05 ENCOUNTER — Ambulatory Visit (HOSPITAL_COMMUNITY)
Admission: EM | Admit: 2019-12-05 | Discharge: 2019-12-05 | Disposition: A | Discharge status: Home / Self Care | Payer: 59 | Attending: Emergency Medicine | Admitting: Emergency Medicine

## 2019-12-05 ENCOUNTER — Encounter (HOSPITAL_COMMUNITY): Payer: Self-pay

## 2019-12-05 DIAGNOSIS — M25512 Pain in left shoulder: Secondary | ICD-10-CM

## 2019-12-05 DIAGNOSIS — M546 Pain in thoracic spine: Secondary | ICD-10-CM

## 2019-12-05 MED ORDER — CYCLOBENZAPRINE HCL 10 MG PO TABS
10.0000 mg | ORAL_TABLET | Freq: Two times a day (BID) | ORAL | 0 refills | Status: DC | PRN
Start: 2019-12-05 — End: 2020-10-25
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
## 2019-12-05 NOTE — Discharge Instructions (Signed)
Take the muscle relaxer Flexeril as needed for muscle spasm; Do not drive, operate machinery, or drink alcohol with this medication as it may make you drowsy.    Follow up with your primary care provider or an orthopedist if your pain is not improving.     
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
## 2019-12-11 ENCOUNTER — Ambulatory Visit: Payer: 59

## 2019-12-23 ENCOUNTER — Other Ambulatory Visit: Payer: Self-pay | Admitting: Family Medicine

## 2019-12-23 MED FILL — ONDANSETRON ODT 4 MG TABLET: 4 | 7 days supply | Qty: 20 | Fill #0

## 2019-12-23 MED FILL — GABAPENTIN 100 MG CAPSULE: 100 | 90 days supply | Qty: 180 | Fill #1

## 2020-01-12 ENCOUNTER — Encounter: Payer: Self-pay | Admitting: Family Medicine

## 2020-01-13 ENCOUNTER — Other Ambulatory Visit: Payer: Self-pay | Admitting: Family Medicine

## 2020-01-13 ENCOUNTER — Other Ambulatory Visit: Payer: Self-pay

## 2020-01-13 ENCOUNTER — Encounter: Payer: Self-pay | Admitting: Gastroenterology

## 2020-01-13 DIAGNOSIS — E785 Hyperlipidemia, unspecified: Secondary | ICD-10-CM

## 2020-01-13 DIAGNOSIS — E119 Type 2 diabetes mellitus without complications: Secondary | ICD-10-CM

## 2020-01-13 MED ORDER — MELOXICAM 15 MG PO TABS: 15.0000 mg | ORAL_TABLET | Freq: Every day | ORAL | 0 refills | Status: DC

## 2020-01-13 MED ORDER — ROSUVASTATIN CALCIUM 20 MG PO TABS: 20.0000 mg | ORAL_TABLET | Freq: Every day | ORAL | 0 refills | Status: DC

## 2020-01-13 MED FILL — MELOXICAM 15 MG TABLET: 15 | 30 days supply | Qty: 30 | Fill #0

## 2020-01-13 MED FILL — ROSUVASTATIN CALCIUM 20 MG: 20 | 90 days supply | Qty: 90 | Fill #0

## 2020-01-14 ENCOUNTER — Encounter: Payer: Self-pay | Admitting: Gastroenterology

## 2020-01-18 ENCOUNTER — Encounter: Payer: Self-pay | Admitting: Gastroenterology

## 2020-01-19 ENCOUNTER — Encounter: Payer: Self-pay | Admitting: Gastroenterology

## 2020-01-28 ENCOUNTER — Other Ambulatory Visit: Payer: Self-pay | Admitting: Family Medicine

## 2020-01-29 MED FILL — OZEMPIC (1 MG/DOSE) 4 MG/3M: 4 | 90 days supply | Qty: 9 | Fill #0

## 2020-02-05 ENCOUNTER — Ambulatory Visit: Payer: 59

## 2020-03-10 ENCOUNTER — Other Ambulatory Visit: Payer: Self-pay

## 2020-03-10 ENCOUNTER — Encounter: Payer: Self-pay | Admitting: Gastroenterology

## 2020-03-10 ENCOUNTER — Ambulatory Visit: Payer: 59

## 2020-03-10 ENCOUNTER — Ambulatory Visit
Admission: RE | Admit: 2020-03-10 | Discharge: 2020-03-10 | Disposition: A | Discharge status: Home / Self Care | Payer: 59 | Source: Ambulatory Visit | Attending: Obstetrics and Gynecology | Admitting: Obstetrics and Gynecology

## 2020-03-10 ENCOUNTER — Ambulatory Visit (INDEPENDENT_AMBULATORY_CARE_PROVIDER_SITE_OTHER): Payer: 59

## 2020-03-10 ENCOUNTER — Other Ambulatory Visit: Payer: Self-pay | Admitting: Gastroenterology

## 2020-03-10 ENCOUNTER — Ambulatory Visit: Payer: 59 | Admitting: Gastroenterology

## 2020-03-10 VITALS — BP 110/68 | HR 111 | Ht 66.0 in | Wt 230.4 lb

## 2020-03-10 DIAGNOSIS — Z1231 Encounter for screening mammogram for malignant neoplasm of breast: Secondary | ICD-10-CM | POA: Diagnosis not present

## 2020-03-10 DIAGNOSIS — R194 Change in bowel habit: Secondary | ICD-10-CM

## 2020-03-10 DIAGNOSIS — H52203 Unspecified astigmatism, bilateral: Secondary | ICD-10-CM | POA: Diagnosis not present

## 2020-03-10 DIAGNOSIS — Z23 Encounter for immunization: Secondary | ICD-10-CM

## 2020-03-10 DIAGNOSIS — H524 Presbyopia: Secondary | ICD-10-CM | POA: Diagnosis not present

## 2020-03-10 DIAGNOSIS — Z1211 Encounter for screening for malignant neoplasm of colon: Secondary | ICD-10-CM | POA: Diagnosis not present

## 2020-03-10 DIAGNOSIS — E119 Type 2 diabetes mellitus without complications: Secondary | ICD-10-CM | POA: Diagnosis not present

## 2020-03-10 MED ORDER — CLENPIQ 10-3.5-12 MG-GM -GM/160ML PO SOLN
1.0000 | Freq: Once | ORAL | 0 refills | Status: DC
Start: 2020-03-10 — End: 2020-03-10

## 2020-03-10 MED FILL — CLENPIQ 10-3.5-12 MG-GM -GM: 10-3.5-12 M | 1 days supply | Qty: 320 | Fill #0

## 2020-03-10 NOTE — Progress Notes (Signed)
Pt is here today for shingrix vaccine. Pt was given shingrix vaccine in left deltoid. Pt tolerated well. 

## 2020-03-10 NOTE — Progress Notes (Signed)
Chief Complaint:   Referring Provider:  Sharlene Dory*      ASSESSMENT AND PLAN;   #1.  Colorectal cancer screening.  #2. Change in bowel habits in form of increasing constipation. (Likely d/t meds-Mobic, Ozempic, off Metformin)  Plan: -Miralax 17g po qd. -Increase water intake -Colon for further evaluation.   Discussed risks & benefits. (Risks including rare perforation req laparotomy, bleeding after bx/polypectomy req blood transfusion, rarely missing neoplasms, risks of anesthesia/sedation). Benefits outweigh the risks. Patient agrees to proceed. All the questions were answered. Consent forms given for review.  HPI:    Ellen Mills is a 49 y.o. female  With change in BMs with pellet-like stools. Increasing constipation ever since Metformin has been stopped and Ozempic started. Some abdominal bloating but no abdominal pain. BMs every other day.  Previously used to have BMs every day  No nausea, vomiting, heartburn, regurgitation, odynophagia or dysphagia.  No significant diarrhea. No melena or hematochezia. No unintentional weight loss. No abdominal pain.  No other over-the-counter nonsteroidals except Mobic Past Medical History:  Diagnosis Date  . Anemia   . Diabetes (HCC) 07/2017  . Hyperlipidemia   . Obesity     Past Surgical History:  Procedure Laterality Date  . ABDOMINAL HYSTERECTOMY    . LEEP      Family History  Problem Relation Age of Onset  . Cancer Mother        lung cancer  . Hyperlipidemia Mother   . Depression Mother   . Anxiety disorder Mother   . Depression Father   . Hypertension Father   . Colon cancer Neg Hx   . Esophageal cancer Neg Hx     Social History   Tobacco Use  . Smoking status: Never Smoker  . Smokeless tobacco: Never Used  Vaping Use  . Vaping Use: Never used  Substance Use Topics  . Alcohol use: No  . Drug use: No    Current Outpatient Medications  Medication Sig Dispense Refill  . aspirin EC  81 MG tablet Take 81 mg by mouth daily.    . cyclobenzaprine (FLEXERIL) 10 MG tablet Take 1 tablet (10 mg total) by mouth 2 (two) times daily as needed for muscle spasms. 20 tablet 0  . fluticasone (FLONASE) 50 MCG/ACT nasal spray Place 2 sprays into both nostrils daily. 16 g 6  . gabapentin (NEURONTIN) 100 MG capsule Take 2 capsules (200 mg total) by mouth at bedtime. 180 capsule 3  . glucose blood test strip Use three times per week to check blood sugar.  DXE11.9 100 each 3  . meloxicam (MOBIC) 15 MG tablet Take 1 tablet (15 mg total) by mouth daily. 30 tablet 0  . ondansetron (ZOFRAN-ODT) 4 MG disintegrating tablet TAKE 1 TABLET (4 MG TOTAL) BY MOUTH EVERY 8 (EIGHT) HOURS AS NEEDED FOR NAUSEA OR VOMITING. 20 tablet 0  . ONETOUCH DELICA LANCETS 33G MISC Use as directed 3 times a week to check blood sugar.  DX E11.9 100 each 3  . rosuvastatin (CRESTOR) 20 MG tablet Take 1 tablet (20 mg total) by mouth daily. 90 tablet 0  . Semaglutide, 1 MG/DOSE, (OZEMPIC, 1 MG/DOSE,) 4 MG/3ML SOPN Inject 1 mg into the skin once a week. 9 mL 1  . venlafaxine (EFFEXOR) 75 MG tablet Take 75 mg by mouth daily.     No current facility-administered medications for this visit.    No Known Allergies  Review of Systems:  Constitutional: Denies fever, chills, diaphoresis, appetite  change and fatigue.  HEENT: Denies photophobia, eye pain, redness, hearing loss, ear pain, congestion, sore throat, rhinorrhea, sneezing, mouth sores, neck pain, neck stiffness and tinnitus.   Respiratory: Denies SOB, DOE, cough, chest tightness,  and wheezing.   Cardiovascular: Denies chest pain, palpitations and leg swelling.  Genitourinary: Denies dysuria, urgency, frequency, hematuria, flank pain and difficulty urinating.  Musculoskeletal: Denies myalgias, back pain, joint swelling, arthralgias and gait problem.  Skin: No rash.  Neurological: Denies dizziness, seizures, syncope, weakness, light-headedness, numbness and headaches.    Hematological: Denies adenopathy. Easy bruising, personal or family bleeding history  Psychiatric/Behavioral: Has anxiety or depression     Physical Exam:    BP 110/68   Pulse (!) 111   Ht 5\' 6"  (1.676 m)   Wt 230 lb 6 oz (104.5 kg)   BMI 37.18 kg/m  Wt Readings from Last 3 Encounters:  03/10/20 230 lb 6 oz (104.5 kg)  10/29/19 232 lb (105.2 kg)  10/13/19 230 lb 4 oz (104.4 kg)   Constitutional:  Well-developed, in no acute distress. Psychiatric: Normal mood and affect. Behavior is normal. HEENT: Pupils normal.  Conjunctivae are normal. No scleral icterus. Cardiovascular: Normal rate, regular rhythm. No edema Pulmonary/chest: Effort normal and breath sounds normal. No wheezing, rales or rhonchi. Abdominal: Soft, nondistended. Nontender. Bowel sounds active throughout. There are no masses palpable. No hepatomegaly. Rectal: Deferred Neurological: Alert and oriented to person place and time. Skin: Skin is warm and dry. No rashes noted.  Data Reviewed: I have personally reviewed following labs and imaging studies  CBC: CBC Latest Ref Rng & Units 10/13/2019 10/24/2018 11/14/2017  WBC 4.0 - 10.5 K/uL 7.0 8.1 9.1  Hemoglobin 12.0 - 15.0 g/dL 01/14/2018 29.5 62.1  Hematocrit 36 - 46 % 39.0 38.1 38.6  Platelets 150 - 400 K/uL 345.0 350.0 -    CMP: CMP Latest Ref Rng & Units 10/13/2019 10/24/2018 11/14/2017  Glucose 70 - 99 mg/dL 01/14/2018) 657(Q) 469(G)  BUN 6 - 23 mg/dL 10 10 7   Creatinine 0.40 - 1.20 mg/dL 295(M 8.41  Sodium 135 - 145 mEq/L 142 139 145(H)  Potassium 3.5 - 5.1 mEq/L 4.4 4.0 4.2  Chloride 96 - 112 mEq/L 103 103 105  CO2 19 - 32 mEq/L 30 27 25   Calcium 8.4 - 10.5 mg/dL 9.8 9.2 9.6  Total Protein 6.0 - 8.3 g/dL 7.6 7.7 7.7  Total Bilirubin 0.2 - 1.2 mg/dL 0.5 0.3 0.4  Alkaline Phos 39 - 117 U/L 57 46 58  AST 0 - 37 U/L 5 5 4   ALT 0 - 35 U/L 11 12 9       3.24, MD 03/10/2020, 9:08 AM  Cc: *

## 2020-03-10 NOTE — Patient Instructions (Signed)
If you are age 50 or older, your body mass index should be between 23-30. Your Body mass index is 37.18 kg/m. If this is out of the aforementioned range listed, please consider follow up with your Primary Care Provider.  If you are age 76 or younger, your body mass index should be between 19-25. Your Body mass index is 37.18 kg/m. If this is out of the aformentioned range listed, please consider follow up with your Primary Care Provider.   You have been scheduled for a colonoscopy. Please follow written instructions given to you at your visit today.  Please pick up your prep supplies at the pharmacy within the next 1-3 days. If you use inhalers (even only as needed), please bring them with you on the day of your procedure.  We have sent the following medications to your pharmacy for you to pick up at your convenience: Clenpiq  Please purchase the following medications over the counter and take as directed: Miralax once daily  Thank you,  Dr. Lynann Bologna

## 2020-03-30 ENCOUNTER — Other Ambulatory Visit: Payer: Self-pay

## 2020-03-30 ENCOUNTER — Other Ambulatory Visit: Payer: Self-pay | Admitting: Family Medicine

## 2020-03-30 DIAGNOSIS — E785 Hyperlipidemia, unspecified: Secondary | ICD-10-CM

## 2020-03-30 DIAGNOSIS — E119 Type 2 diabetes mellitus without complications: Secondary | ICD-10-CM

## 2020-03-30 MED ORDER — ROSUVASTATIN CALCIUM 20 MG PO TABS: 20.0000 mg | ORAL_TABLET | Freq: Every day | ORAL | 0 refills | Status: DC

## 2020-03-30 MED ORDER — MELOXICAM 15 MG PO TABS: 15.0000 mg | ORAL_TABLET | Freq: Every day | ORAL | 0 refills | Status: DC

## 2020-03-30 MED FILL — MELOXICAM 15 MG TABLET: 15 | 30 days supply | Qty: 30 | Fill #0

## 2020-04-04 MED FILL — ROSUVASTATIN CALCIUM 20 MG: 20 | 90 days supply | Qty: 90 | Fill #0

## 2020-04-05 ENCOUNTER — Ambulatory Visit: Payer: 59

## 2020-04-15 ENCOUNTER — Ambulatory Visit: Payer: 59 | Admitting: Family Medicine

## 2020-04-19 MED FILL — OZEMPIC (1 MG/DOSE) 4 MG/3M: 4 | 90 days supply | Qty: 9 | Fill #1

## 2020-04-29 ENCOUNTER — Encounter: Payer: 59 | Admitting: Gastroenterology

## 2020-05-03 ENCOUNTER — Other Ambulatory Visit: Payer: Self-pay

## 2020-05-03 ENCOUNTER — Ambulatory Visit (AMBULATORY_SURGERY_CENTER): Payer: 59 | Admitting: Gastroenterology

## 2020-05-03 ENCOUNTER — Encounter: Payer: Self-pay | Admitting: Gastroenterology

## 2020-05-03 VITALS — BP 103/66 | HR 63 | Temp 97.7°F | Resp 15 | Ht 66.0 in | Wt 230.0 lb

## 2020-05-03 DIAGNOSIS — Z1211 Encounter for screening for malignant neoplasm of colon: Secondary | ICD-10-CM | POA: Diagnosis not present

## 2020-05-03 MED ORDER — SODIUM CHLORIDE 0.9 % IV SOLN: 500.0000 mL | Freq: Once | INTRAVENOUS | Status: DC

## 2020-05-03 NOTE — Progress Notes (Signed)
Pt's states no medical or surgical changes since previsit or office visit. 

## 2020-05-03 NOTE — Progress Notes (Signed)
pt tolerated well. VSS. awake and to recovery. Report given to RN.  

## 2020-05-03 NOTE — Progress Notes (Signed)
C.W. vital signs. 

## 2020-05-03 NOTE — Op Note (Signed)
[facility] Patient Name: Ellen SkeensGesila Mills Procedure Date: 05/03/2020 9:51 AM MRN: [id number] Endoscopist: Lynann Bolognaajesh Kharma Sampsel , MD Age: 50 Referring MD:  Date of Birth: 10/20/1970 Gender: Female Account #: [id number] Procedure:                Colonoscopy Indications:              Screening for colorectal malignant neoplasm Medicines:                Monitored Anesthesia Care Procedure:                Pre-Anesthesia Assessment:                           - Prior to the procedure, a History and Physical                            was performed, and patient medications and                            allergies were reviewed. The patient's tolerance of                            previous anesthesia was also reviewed. The risks                            and benefits of the procedure and the sedation                            options and risks were discussed with the patient.                            All questions were answered, and informed consent                            was obtained. Prior Anticoagulants: The patient has                            taken no previous anticoagulant or antiplatelet                            agents. ASA Grade Assessment: II - A patient with                            mild systemic disease. After reviewing the risks                            and benefits, the patient was deemed in                            satisfactory condition to undergo the procedure.                           After obtaining informed consent, the colonoscope  was passed under direct vision. Throughout the                            procedure, the patient's blood pressure, pulse, and                            oxygen saturations were monitored continuously. The                            Olympus PFC-[id number] (#[id number]) Colonoscope was                            introduced through the anus and advanced to the the                            cecum, identified by  appendiceal orifice and                            ileocecal valve. The colonoscopy was performed                            without difficulty. The patient tolerated the                            procedure well. The quality of the bowel                            preparation was good. The ileocecal valve,                            appendiceal orifice, and rectum were photographed. Scope In: 10:02:47 AM Scope Out: 10:17:02 AM Scope Withdrawal Time: 0 hours 8 minutes 55 seconds  Total Procedure Duration: 0 hours 14 minutes 15 seconds  Findings:                 Multiple medium-mouthed diverticula were found in                            ascending colon, few in sigmoid colon. There were                            predominantly in the ascending colon.                           Non-bleeding internal hemorrhoids were found during                            retroflexion. The hemorrhoids were small.                           The exam was otherwise without abnormality on                            direct and retroflexion views. Complications:  No immediate complications. Estimated Blood Loss:     Estimated blood loss: none. Impression:               - Mild pancolonic diverticulosis predominantly in                            the right colon.                           - Non-bleeding internal hemorrhoids.                           - The examination was otherwise normal on direct                            and retroflexion views.                           - No specimens collected. Recommendation:           - Patient has a contact number available for                            emergencies. The signs and symptoms of potential                            delayed complications were discussed with the                            patient. Return to normal activities tomorrow.                            Written discharge instructions were provided to the                            patient.                            - Resume previous diet.                           - Miralax 1 capful (17 grams) in 8 ounces of water                            PO daily.                           - Repeat colonoscopy in 10 years for screening                            purposes. Earlier, if with any new problems or                            change in family history.                           - Return to GI office PRN.                           -  The findings and recommendations were discussed                            with the patient's daughter. Lynann Bologna, MD 05/03/2020 10:24:13 AM This report has been signed electronically.

## 2020-05-03 NOTE — Patient Instructions (Signed)
HANDOUTS PROVIDED ON: DIVERTICULOSIS & HEMORRHOIDS   You may resume your previous diet and medication schedule.    Take 1 capful (17g) Miralax with 8 oz of water daily.  Thank you for allowing [place] to care for you today!!!   YOU HAD AN ENDOSCOPIC PROCEDURE TODAY AT THE [facility]:   Refer to the procedure report that was given to you for any specific questions about what was found during the examination.  If the procedure report does not answer your questions, please call your gastroenterologist to clarify.  If you requested that your care partner not be given the details of your procedure findings, then the procedure report has been included in a sealed envelope for you to review at your convenience later.  YOU SHOULD EXPECT: Some feelings of bloating in the abdomen. Passage of more gas than usual.  Walking can help get rid of the air that was put into your GI tract during the procedure and reduce the bloating. If you had a lower endoscopy (such as a colonoscopy or flexible sigmoidoscopy) you may notice spotting of blood in your stool or on the toilet paper. If you underwent a bowel prep for your procedure, you may not have a normal bowel movement for a few days.  Please Note:  You might notice some irritation and congestion in your nose or some drainage.  This is from the oxygen used during your procedure.  There is no need for concern and it should clear up in a day or so.  SYMPTOMS TO REPORT IMMEDIATELY:   Following lower endoscopy (colonoscopy or flexible sigmoidoscopy):  Excessive amounts of blood in the stool  Significant tenderness or worsening of abdominal pains  Swelling of the abdomen that is new, acute  Fever of 100F or higher  For urgent or emergent issues, a gastroenterologist can be reached at any hour by calling [phone]. Do not use MyChart messaging for urgent concerns.    DIET:  We do recommend a small meal at first, but then you may proceed to your  regular diet.  Drink plenty of fluids but you should avoid alcoholic beverages for 24 hours.  ACTIVITY:  You should plan to take it easy for the rest of today and you should NOT DRIVE or use heavy machinery until tomorrow (because of the sedation medicines used during the test).    FOLLOW UP: Our staff will call the number listed on your records Thursday morning between 7:15 am and 8:15 am to check on you and address any questions or concerns that you may have regarding the information given to you following your procedure. If we do not reach you, we will leave a message.  We will attempt to reach you two times.  During this call, we will ask if you have developed any symptoms of COVID 19. If you develop any symptoms (ie: fever, flu-like symptoms, shortness of breath, cough etc.) before then, please call [phone].  If you test positive for Covid 19 in the 2 weeks post procedure, please call and report this information to [place].    If any biopsies were taken you will be contacted by phone or by letter within the next 1-3 weeks.  Please call us at [phone] if you have not heard about the biopsies in 3 weeks.    SIGNATURES/CONFIDENTIALITY: You and/or your care partner have signed paperwork which will be entered into your electronic medical record.  These signatures attest to the fact that that the information  on your After Visit Summary has been reviewed and is understood.  Full responsibility of the confidentiality of this discharge information lies with you and/or your care-partner.  

## 2020-05-05 ENCOUNTER — Telehealth: Payer: Self-pay | Admitting: *Deleted

## 2020-05-05 ENCOUNTER — Telehealth: Payer: Self-pay

## 2020-05-05 NOTE — Telephone Encounter (Signed)
Second follow up call attempt, no answer, LM 

## 2020-05-05 NOTE — Telephone Encounter (Signed)
  Follow up Call-  Call back number 05/03/2020  Post procedure Call Back phone  # [phone]  Permission to leave phone message Yes  Some recent data might be hidden     Patient questions:  Message left to call us if necessary.

## 2020-05-10 ENCOUNTER — Ambulatory Visit: Payer: 59 | Admitting: Family Medicine

## 2020-05-10 ENCOUNTER — Other Ambulatory Visit: Payer: Self-pay

## 2020-05-10 ENCOUNTER — Encounter: Payer: Self-pay | Admitting: Family Medicine

## 2020-05-10 ENCOUNTER — Other Ambulatory Visit: Payer: Self-pay | Admitting: Family Medicine

## 2020-05-10 VITALS — BP 104/68 | HR 92 | Temp 98.2°F | Ht 66.0 in | Wt 226.5 lb

## 2020-05-10 DIAGNOSIS — E1165 Type 2 diabetes mellitus with hyperglycemia: Secondary | ICD-10-CM

## 2020-05-10 DIAGNOSIS — E785 Hyperlipidemia, unspecified: Secondary | ICD-10-CM

## 2020-05-10 DIAGNOSIS — Z23 Encounter for immunization: Secondary | ICD-10-CM

## 2020-05-10 DIAGNOSIS — N3000 Acute cystitis without hematuria: Secondary | ICD-10-CM | POA: Diagnosis not present

## 2020-05-10 DIAGNOSIS — H6123 Impacted cerumen, bilateral: Secondary | ICD-10-CM | POA: Diagnosis not present

## 2020-05-10 LAB — COMPREHENSIVE METABOLIC PANEL
ALT: 10 U/L (ref 0–35)
AST: 6 U/L (ref 0–37)
Albumin: 4.2 g/dL (ref 3.5–5.2)
Alkaline Phosphatase: 53 U/L (ref 39–117)
BUN: 11 mg/dL (ref 6–23)
CO2: 33 mEq/L — ABNORMAL HIGH (ref 19–32)
Calcium: 9.7 mg/dL (ref 8.4–10.5)
Chloride: 105 mEq/L (ref 96–112)
Creatinine, Ser: 0.89 mg/dL (ref 0.40–1.20)
GFR: 75.6 mL/min (ref >60.00)
Glucose, Bld: 94 mg/dL (ref 70–99)
Potassium: 4.2 mEq/L (ref 3.5–5.1)
Sodium: 141 mEq/L (ref 135–145)
Total Bilirubin: 0.4 mg/dL (ref 0.2–1.2)
Total Protein: 7.5 g/dL (ref 6.0–8.3)

## 2020-05-10 LAB — LIPID PANEL
Cholesterol: 119 mg/dL (ref 0–200)
HDL: 36.1 mg/dL — ABNORMAL LOW (ref >39.00)
LDL Cholesterol: 61 mg/dL (ref 0–99)
NonHDL: 82.98
Total CHOL/HDL Ratio: 3
Triglycerides: 112 mg/dL (ref 0.0–149.0)
VLDL: 22.4 mg/dL (ref 0.0–40.0)

## 2020-05-10 LAB — URINALYSIS, MICROSCOPIC ONLY: RBC / HPF: NONE SEEN (ref 0–?)

## 2020-05-10 LAB — HEMOGLOBIN A1C: Hgb A1c MFr Bld: 6 % (ref 4.6–6.5)

## 2020-05-10 MED ORDER — SULFAMETHOXAZOLE-TRIMETHOPRIM 800-160 MG PO TABS
1.0000 | ORAL_TABLET | Freq: Two times a day (BID) | ORAL | 0 refills | Status: DC
Start: 2020-05-10 — End: 2020-05-10

## 2020-05-10 MED FILL — SULFAMETHOXAZOLE-TMP DS TAB: 800-160 | 3 days supply | Qty: 6 | Fill #0

## 2020-05-10 NOTE — Progress Notes (Signed)
Subjective:   Chief Complaint  Patient presents with  . Follow-up    Ellen Mills is a 49 y.o. female here for follow-up of diabetes.   Tyanna' Patient does not require insulin.   Medications include: Ozempic 1 mg weekly Diet is generally healthy overall.  Exercise: none  Hyperlipidemia Patient presents for dyslipidemia follow up. Currently being treated with Crestor 20 mg/d and compliance with treatment thus far has been good. She denies myalgias. Diet/exercise The patient is not known to have coexisting coronary artery disease.  UTI 2 weeks of dysuria, freq, abd pain, some urgency, retention. No hesitancy, fevers, N/V, d/c, bleeding, flank pain. Tried some otc remedies without relief. No hx of recurrent UTI's.   Past Medical History:  Diagnosis Date  . Anemia   . Diabetes (HCC) 07/2017  . Hyperlipidemia   . Obesity      Related testing: Retinal exam: Done Pneumovax: done  Objective:  BP 104/68 (BP Location: Right Arm, Patient Position: Sitting, Cuff Size: Large)   Pulse 92   Temp 98.2 F (36.8 C) (Oral)   Ht 5\' 6"  (1.676 m)   Wt 226 lb 8 oz (102.7 kg)   SpO2 97%   BMI 36.56 kg/m  General:  Well developed, well nourished, in no apparent distress Ears: Obstructed w cerumen b/l Skin:  Warm, no pallor or diaphoresis Abd: BS+, S, NT, ND  Lungs:  CTAB, no access msc use Cardio:  RRR, no bruits, no LE edema Musculoskeletal:  No CVA ttp Neuro:  Sensation intact to pinprick on feet Psych: Age appropriate judgment and insight  Assessment:   Type 2 diabetes mellitus with hyperglycemia, without long-term current use of insulin (HCC) - Plan: Lipid panel, Comprehensive metabolic panel, Hemoglobin A1c  Dyslipidemia  Acute cystitis without hematuria - Plan: Urine Culture, Urine Microscopic Only   Plan:   1. Cont Ozempic. Does not need to monitor BS's at home at this time. Counseled on diet and exercise. Ck above. 2. Cont Crestor. 3. Ck urine. Empirically  tx w Bactrim bid for 3 d.  F/u in 6 mo for CPE or prn. The patient voiced understanding and agreement to the plan.  [place], DO 05/10/20 7:37 AM

## 2020-05-10 NOTE — Addendum Note (Signed)
Addended by: Scharlene Gloss B on: 05/10/2020 08:00 AM   Modules accepted: Orders

## 2020-05-10 NOTE — Patient Instructions (Addendum)
Give [place] 2-3 business days to get the results of your labs back.   Keep the diet clean and stay active.  Stay hydrated.  Try Metamucil daily as needed.  Stay hydrated.   Warning signs/symptoms: Uncontrollable nausea/vomiting, fevers, worsening symptoms despite treatment, confusion.  Give [place] around 2 business days to get culture back to you.  OK to use Debrox (peroxide) in the ear to loosen up wax. Also recommend using a bulb syringe (for removing boogers from baby's noses) to flush through warm water and vinegar (3-4:1 ratio). An alternative, though more expensive, is an elephant ear washer wax removal kit. Do not use Q-tips as this can impact wax further.  Let us know if you need anything.

## 2020-05-11 ENCOUNTER — Ambulatory Visit: Payer: 59

## 2020-05-11 LAB — URINE CULTURE
MICRO NUMBER:: 11481250
SPECIMEN QUALITY:: ADEQUATE

## 2020-06-12 ENCOUNTER — Other Ambulatory Visit: Payer: Self-pay | Admitting: Family Medicine

## 2020-06-12 ENCOUNTER — Other Ambulatory Visit: Payer: Self-pay

## 2020-06-12 DIAGNOSIS — E119 Type 2 diabetes mellitus without complications: Secondary | ICD-10-CM

## 2020-06-12 DIAGNOSIS — E785 Hyperlipidemia, unspecified: Secondary | ICD-10-CM

## 2020-06-13 ENCOUNTER — Other Ambulatory Visit: Payer: Self-pay | Admitting: Family Medicine

## 2020-06-13 MED ORDER — ROSUVASTATIN CALCIUM 20 MG PO TABS: 20.0000 mg | ORAL_TABLET | Freq: Every day | ORAL | 1 refills | Status: DC

## 2020-06-13 MED ORDER — MELOXICAM 15 MG PO TABS: 15.0000 mg | ORAL_TABLET | Freq: Every day | ORAL | 0 refills | Status: DC

## 2020-06-13 MED FILL — MELOXICAM 15 MG TABLET: 15 | 30 days supply | Qty: 30 | Fill #0

## 2020-06-17 ENCOUNTER — Ambulatory Visit: Payer: Self-pay | Attending: Internal Medicine

## 2020-06-17 ENCOUNTER — Other Ambulatory Visit (HOSPITAL_COMMUNITY): Payer: Self-pay | Admitting: Internal Medicine

## 2020-06-17 DIAGNOSIS — Z23 Encounter for immunization: Secondary | ICD-10-CM

## 2020-06-17 NOTE — Progress Notes (Signed)
   Covid-19 Vaccination Clinic  Name:  Raylin Diguglielmo    MRN: [id number] DOB: Apr 21, 1970  06/17/2020  Ms. Vegh was observed post Covid-19 immunization for 15 minutes without incident. She was provided with Vaccine Information Sheet and instruction to access the V-Safe system.   Ms. Ruehl was instructed to call 911 with any severe reactions post vaccine: Marland Kitchen Difficulty breathing  . Swelling of face and throat  . A fast heartbeat  . A bad rash all over body  . Dizziness and weakness   Immunizations Administered    Name Date Dose VIS Date Route   PFIZER Comrnaty(Gray TOP) Covid-19 Vaccine 06/17/2020 11:53 AM 0.3 mL 03/17/2020 Intramuscular   Manufacturer: [employer], [employer]   Lot: [id number]   NDC: [phone]

## 2020-07-06 ENCOUNTER — Other Ambulatory Visit: Payer: Self-pay | Admitting: Family Medicine

## 2020-07-06 MED FILL — GABAPENTIN 100 MG CAPSULE: 100 | 90 days supply | Qty: 180 | Fill #2

## 2020-07-06 MED FILL — ROSUVASTATIN CALCIUM 20 MG: 20 | 90 days supply | Qty: 90 | Fill #0

## 2020-07-29 ENCOUNTER — Other Ambulatory Visit (HOSPITAL_COMMUNITY): Payer: Self-pay

## 2020-07-29 MED FILL — Semaglutide Soln Pen-inj 1 MG/DOSE (4 MG/3ML): SUBCUTANEOUS | 84 days supply | Qty: 9 | Fill #0 | Status: AC

## 2020-08-05 ENCOUNTER — Other Ambulatory Visit: Payer: Self-pay | Admitting: Family Medicine

## 2020-08-05 ENCOUNTER — Other Ambulatory Visit (HOSPITAL_COMMUNITY): Payer: Self-pay

## 2020-09-08 ENCOUNTER — Other Ambulatory Visit: Payer: Self-pay | Admitting: Obstetrics and Gynecology

## 2020-09-08 DIAGNOSIS — Z1231 Encounter for screening mammogram for malignant neoplasm of breast: Secondary | ICD-10-CM

## 2020-10-11 ENCOUNTER — Other Ambulatory Visit: Payer: Self-pay | Admitting: Family Medicine

## 2020-10-11 MED FILL — Rosuvastatin Calcium Tab 20 MG: ORAL | 90 days supply | Qty: 90 | Fill #0 | Status: AC

## 2020-10-11 MED FILL — Semaglutide Soln Pen-inj 1 MG/DOSE (4 MG/3ML): SUBCUTANEOUS | 84 days supply | Qty: 9 | Fill #1 | Status: AC

## 2020-10-12 ENCOUNTER — Other Ambulatory Visit (HOSPITAL_COMMUNITY): Payer: Self-pay

## 2020-10-25 ENCOUNTER — Other Ambulatory Visit: Payer: Self-pay

## 2020-10-25 ENCOUNTER — Ambulatory Visit (INDEPENDENT_AMBULATORY_CARE_PROVIDER_SITE_OTHER): Payer: 59 | Admitting: Family Medicine

## 2020-10-25 ENCOUNTER — Encounter: Payer: Self-pay | Admitting: Family Medicine

## 2020-10-25 ENCOUNTER — Other Ambulatory Visit (HOSPITAL_COMMUNITY): Payer: Self-pay

## 2020-10-25 VITALS — BP 110/78 | HR 90 | Temp 98.3°F | Ht 66.0 in | Wt 222.0 lb

## 2020-10-25 DIAGNOSIS — Z01419 Encounter for gynecological examination (general) (routine) without abnormal findings: Secondary | ICD-10-CM | POA: Diagnosis not present

## 2020-10-25 DIAGNOSIS — Z6836 Body mass index (BMI) 36.0-36.9, adult: Secondary | ICD-10-CM

## 2020-10-25 DIAGNOSIS — E1165 Type 2 diabetes mellitus with hyperglycemia: Secondary | ICD-10-CM | POA: Diagnosis not present

## 2020-10-25 DIAGNOSIS — Z0000 Encounter for general adult medical examination without abnormal findings: Secondary | ICD-10-CM | POA: Diagnosis not present

## 2020-10-25 DIAGNOSIS — Z90710 Acquired absence of both cervix and uterus: Secondary | ICD-10-CM | POA: Diagnosis not present

## 2020-10-25 DIAGNOSIS — E119 Type 2 diabetes mellitus without complications: Secondary | ICD-10-CM | POA: Diagnosis not present

## 2020-10-25 LAB — COMPREHENSIVE METABOLIC PANEL
ALT: 12 U/L (ref 0–35)
AST: 7 U/L (ref 0–37)
Albumin: 4.3 g/dL (ref 3.5–5.2)
Alkaline Phosphatase: 56 U/L (ref 39–117)
BUN: 12 mg/dL (ref 6–23)
CO2: 31 mEq/L (ref 19–32)
Calcium: 9.7 mg/dL (ref 8.4–10.5)
Chloride: 104 mEq/L (ref 96–112)
Creatinine, Ser: 0.9 mg/dL (ref 0.40–1.20)
GFR: 74.35 mL/min (ref >60.00)
Glucose, Bld: 90 mg/dL (ref 70–99)
Potassium: 4.3 mEq/L (ref 3.5–5.1)
Sodium: 141 mEq/L (ref 135–145)
Total Bilirubin: 0.4 mg/dL (ref 0.2–1.2)
Total Protein: 7.5 g/dL (ref 6.0–8.3)

## 2020-10-25 LAB — CBC
HCT: 40.7 % (ref 36.0–46.0)
Hemoglobin: 13.4 g/dL (ref 12.0–15.0)
MCHC: 33 g/dL (ref 30.0–36.0)
MCV: 86.9 fl (ref 78.0–100.0)
Platelets: 342 10*3/uL (ref 150.0–400.0)
RBC: 4.69 Mil/uL (ref 3.87–5.11)
RDW: 14 % (ref 11.5–15.5)
WBC: 8.1 10*3/uL (ref 4.0–10.5)

## 2020-10-25 LAB — LIPID PANEL
Cholesterol: 134 mg/dL (ref 0–200)
HDL: 41.1 mg/dL (ref >39.00)
LDL Cholesterol: 70 mg/dL (ref 0–99)
NonHDL: 92.58
Total CHOL/HDL Ratio: 3
Triglycerides: 114 mg/dL (ref 0.0–149.0)
VLDL: 22.8 mg/dL (ref 0.0–40.0)

## 2020-10-25 LAB — MICROALBUMIN / CREATININE URINE RATIO
Creatinine,U: 134.5 mg/dL
Microalb Creat Ratio: 0.5 mg/g (ref 0.0–30.0)
Microalb, Ur: <0.7 mg/dL (ref 0.0–1.9)

## 2020-10-25 LAB — HEMOGLOBIN A1C: Hgb A1c MFr Bld: 6 % (ref 4.6–6.5)

## 2020-10-25 MED ORDER — FREESTYLE LITE W/DEVICE KIT
PACK | 0 refills | Status: AC
  Filled 2020-10-25: qty 1, 30d supply, fill #0

## 2020-10-25 MED ORDER — MELOXICAM 15 MG PO TABS
15.0000 mg | ORAL_TABLET | Freq: Every day | ORAL | 3 refills | Status: DC
  Filled 2020-10-25: qty 30, 30d supply, fill #0
  Filled 2021-07-10: qty 30, 30d supply, fill #1
  Filled 2021-10-04: qty 30, 30d supply, fill #2

## 2020-10-25 MED ORDER — FREESTYLE LANCETS MISC
1.0000 | 3 refills | Status: AC | PRN
  Filled 2020-10-25: qty 100, 90d supply, fill #0
  Filled 2021-04-06: qty 100, 90d supply, fill #1

## 2020-10-25 MED ORDER — FREESTYLE LITE TEST VI STRP
1.0000 | ORAL_STRIP | 3 refills | Status: AC | PRN
  Filled 2020-10-25: qty 50, 50d supply, fill #0
  Filled 2021-04-06: qty 50, 50d supply, fill #1

## 2020-10-25 MED ORDER — GABAPENTIN 100 MG PO CAPS
200.0000 mg | ORAL_CAPSULE | Freq: Every evening | ORAL | 2 refills | Status: DC
  Filled 2020-10-25: qty 180, 90d supply, fill #0
  Filled 2021-04-06: qty 180, 90d supply, fill #1
  Filled 2021-07-10: qty 180, 90d supply, fill #2

## 2020-10-25 NOTE — Patient Instructions (Addendum)
Give [place] 2-3 business days to get the results of your labs back.   Keep the diet clean and stay active.  Consider getting your second covid booster at your convenience.   OK to use Debrox (peroxide) in the ear to loosen up wax. Also recommend using a bulb syringe (for removing boogers from baby's noses) to flush through warm water and vinegar (3-4:1 ratio). An alternative, though more expensive, is an elephant ear washer wax removal kit. Do not use Q-tips as this can impact wax further.  Let us know if you need anything.

## 2020-10-25 NOTE — Progress Notes (Signed)
Chief Complaint  Patient presents with   Annual Exam     Well Woman Ellen Mills is here for a complete physical.   Her last physical was >1 year ago.  Current diet: in general, a "healthy" diet. Current exercise: walking. Weight is decreasing intentionally and she denies fatigue out of ordinary. Seatbelt? Yes  Health Maintenance Mammogram- Yes Colon cancer screening-Yes Shingrix- Yes Tetanus- Yes Hep C screening- Yes HIV screening- Yes  Past Medical History:  Diagnosis Date   Anemia    Diabetes (HCC) 07/2017   Hyperlipidemia    Obesity      Past Surgical History:  Procedure Laterality Date   ABDOMINAL HYSTERECTOMY     LEEP      Medications  Current Outpatient Medications on File Prior to Visit  Medication Sig Dispense Refill   aspirin EC 81 MG tablet Take 81 mg by mouth daily.     gabapentin (NEURONTIN) 100 MG capsule TAKE 2 CAPSULES (200 MG TOTAL) BY MOUTH AT BEDTIME. 180 capsule 3   meloxicam (MOBIC) 15 MG tablet TAKE 1 TABLET (15 MG TOTAL) BY MOUTH DAILY. 30 tablet 0   rosuvastatin (CRESTOR) 20 MG tablet TAKE 1 TABLET (20 MG TOTAL) BY MOUTH DAILY. 90 tablet 1   Semaglutide, 1 MG/DOSE, 4 MG/3ML SOPN INJECT 1 MG INTO THE SKIN ONCE A WEEK. 9 mL 1   Allergies No Known Allergies  Review of Systems: Constitutional:  no unexpected weight changes Eye:  no recent significant change in vision Ear/Nose/Mouth/Throat:  Ears:  no recent change in hearing Nose/Mouth/Throat:  no complaints of nasal congestion, no sore throat Cardiovascular: no chest pain Respiratory:  no shortness of breath Gastrointestinal:  no abdominal pain, no change in bowel habits GU:  Female: negative for dysuria or pelvic pain Musculoskeletal/Extremities:  no pain of the joints Integumentary (Skin/Breast):  no abnormal skin lesions reported Neurologic:  no headaches Endocrine:  denies fatigue  Exam BP 110/78   Pulse 90   Temp 98.3 F (36.8 C) (Oral)   Ht 5\' 6"  (1.676 m)   Wt 222  lb (100.7 kg)   SpO2 98%   BMI 35.83 kg/m  General:  well developed, well nourished, in no apparent distress Skin:  no significant moles, warts, or growths Head:  no masses, lesions, or tenderness Eyes:  pupils equal and round, sclera anicteric without injection Ears:  canals without lesions, TMs shiny without retraction, no obvious effusion, no erythema Nose:  nares patent, septum midline, mucosa normal, and no drainage or sinus tenderness Throat/Pharynx:  lips and gingiva without lesion; tongue and uvula midline; non-inflamed pharynx; no exudates or postnasal drainage Neck: neck supple without adenopathy, thyromegaly, or masses Lungs:  clear to auscultation, breath sounds equal bilaterally, no respiratory distress Cardio:  regular rate and rhythm, no LE edema Abdomen:  abdomen soft, nontender; bowel sounds normal; no masses or organomegaly Genital: Defer to GYN Musculoskeletal:  symmetrical muscle groups noted without atrophy or deformity Extremities:  no clubbing, cyanosis, or edema, no deformities, no skin discoloration Neuro:  gait normal; deep tendon reflexes normal and symmetric Psych: well oriented with normal range of affect and appropriate judgment/insight  Assessment and Plan  Well adult exam  Type 2 diabetes mellitus with hyperglycemia, without long-term current use of insulin (HCC) - Plan: Microalbumin / creatinine urine ratio, Lipid panel, Hemoglobin A1c, CBC, Comprehensive metabolic panel  Class 2 severe obesity with serious comorbidity and body mass index (BMI) of 36.0 to 36.9 in adult, unspecified obesity type (HCC)  Well 50 y.o. female. Counseled on diet and exercise. Other orders as above. Follow up in 6 mo. The patient voiced understanding and agreement to the plan.  Jilda Roche [place], DO 10/25/20 7:36 AM

## 2020-10-25 NOTE — Addendum Note (Signed)
Addended by: Scharlene Gloss B on: 10/25/2020 07:46 AM   Modules accepted: Orders

## 2020-11-11 ENCOUNTER — Ambulatory Visit: Payer: 59 | Attending: Internal Medicine

## 2020-11-11 ENCOUNTER — Other Ambulatory Visit: Payer: Self-pay

## 2020-11-11 DIAGNOSIS — Z23 Encounter for immunization: Secondary | ICD-10-CM

## 2020-11-11 MED ORDER — PFIZER-BIONT COVID-19 VAC-TRIS 30 MCG/0.3ML IM SUSP
INTRAMUSCULAR | 0 refills | Status: DC
  Filled 2020-11-11: qty 0.3, 1d supply, fill #0

## 2020-11-11 NOTE — Progress Notes (Signed)
   Covid-19 Vaccination Clinic  Name:  Shirlee Whitmire    MRN: [id number] DOB: Apr 18, 1970  11/11/2020  Ms. Castronova was observed post Covid-19 immunization for 15 minutes without incident. She was provided with Vaccine Information Sheet and instruction to access the V-Safe system.   Ms. Roat was instructed to call 911 with any severe reactions post vaccine: Difficulty breathing  Swelling of face and throat  A fast heartbeat  A bad rash all over body  Dizziness and weakness   Immunizations Administered     Name Date Dose VIS Date Route   PFIZER Comrnaty(Gray TOP) Covid-19 Vaccine 11/11/2020  2:48 PM 0.3 mL 03/17/2020 Intramuscular   Manufacturer: [employer], [employer]   Lot: [id number]   NDC: [address]-6      Drusilla Kanner, PharmD, MBA Clinical Acute Care Pharmacist

## 2020-12-28 ENCOUNTER — Other Ambulatory Visit (HOSPITAL_COMMUNITY): Payer: Self-pay

## 2020-12-28 ENCOUNTER — Telehealth: Payer: 59 | Admitting: Nurse Practitioner

## 2020-12-28 DIAGNOSIS — R399 Unspecified symptoms and signs involving the genitourinary system: Secondary | ICD-10-CM

## 2020-12-28 DIAGNOSIS — M545 Low back pain, unspecified: Secondary | ICD-10-CM

## 2020-12-28 MED ORDER — CEPHALEXIN 500 MG PO CAPS
500.0000 mg | ORAL_CAPSULE | Freq: Two times a day (BID) | ORAL | 0 refills | Status: DC
  Filled 2020-12-28: qty 14, 7d supply, fill #0

## 2020-12-28 NOTE — Progress Notes (Signed)
As stated previously. You need a face to face visit.

## 2020-12-28 NOTE — Progress Notes (Signed)
Based on what you shared with me it looks like you have uti symptoms with back pain,that should be evaluated in a face to face office visit. Due to the associating back pain you will need a urinalysis and urine culture for proper treatment. NOTE: There will be NO CHARGE for this eVisit   If you are having a true medical emergency please call 911.      For an urgent face to face visit, [place] has six urgent care centers for your convenience:     [facility] [place] Get Driving Directions [phone] [address] [place], [place] [address]    [facility] ([place]) Get Driving Directions [phone] [address] [place], [place] [address]  [facility] ([place] [facility] [place]) Get Driving Directions [phone] [address] [place],  [place]  [address]  [facility] Get Driving Directions [phone] [address] [place], [place] [address]   [facility] [place] Get Driving Directions  [phone] [address] [place], [place] [address]   [facility] [place] Get Driving Directions [phone] [address] [place], [place] [address]  Your MyChart E-visit questionnaire answers were reviewed by a board certified advanced clinical practitioner to complete your personal care plan based on your specific symptoms.  Thank you for using e-Visits.         

## 2021-01-11 ENCOUNTER — Other Ambulatory Visit: Payer: Self-pay | Admitting: Family Medicine

## 2021-01-11 ENCOUNTER — Other Ambulatory Visit (HOSPITAL_COMMUNITY): Payer: Self-pay

## 2021-01-11 DIAGNOSIS — E785 Hyperlipidemia, unspecified: Secondary | ICD-10-CM

## 2021-01-11 DIAGNOSIS — E119 Type 2 diabetes mellitus without complications: Secondary | ICD-10-CM

## 2021-01-11 MED ORDER — OZEMPIC (1 MG/DOSE) 4 MG/3ML ~~LOC~~ SOPN
1.0000 mg | PEN_INJECTOR | SUBCUTANEOUS | 1 refills | Status: DC
  Filled 2021-01-11: qty 9, 84d supply, fill #0

## 2021-01-11 MED ORDER — ROSUVASTATIN CALCIUM 20 MG PO TABS
20.0000 mg | ORAL_TABLET | Freq: Every day | ORAL | 1 refills | Status: DC
  Filled 2021-01-11: qty 90, 90d supply, fill #0
  Filled 2021-04-06: qty 90, 90d supply, fill #1

## 2021-03-13 ENCOUNTER — Ambulatory Visit
Admission: RE | Admit: 2021-03-13 | Discharge: 2021-03-13 | Disposition: A | Discharge status: Home / Self Care | Payer: 59 | Source: Ambulatory Visit | Attending: Obstetrics and Gynecology | Admitting: Obstetrics and Gynecology

## 2021-03-13 DIAGNOSIS — Z1231 Encounter for screening mammogram for malignant neoplasm of breast: Secondary | ICD-10-CM | POA: Diagnosis not present

## 2021-04-05 ENCOUNTER — Other Ambulatory Visit (HOSPITAL_COMMUNITY): Payer: Self-pay

## 2021-04-05 ENCOUNTER — Telehealth: Payer: 59 | Admitting: Physician Assistant

## 2021-04-05 ENCOUNTER — Other Ambulatory Visit: Payer: Self-pay

## 2021-04-05 DIAGNOSIS — R3989 Other symptoms and signs involving the genitourinary system: Secondary | ICD-10-CM | POA: Diagnosis not present

## 2021-04-05 MED ORDER — SULFAMETHOXAZOLE-TRIMETHOPRIM 800-160 MG PO TABS
1.0000 | ORAL_TABLET | Freq: Two times a day (BID) | ORAL | 0 refills | Status: DC
Start: 2021-04-05 — End: 2021-04-21
  Filled 2021-04-05: qty 10, 5d supply, fill #0

## 2021-04-05 NOTE — Progress Notes (Signed)

## 2021-04-06 ENCOUNTER — Other Ambulatory Visit (HOSPITAL_COMMUNITY): Payer: Self-pay

## 2021-04-21 ENCOUNTER — Telehealth: Payer: 59 | Admitting: Physician Assistant

## 2021-04-21 ENCOUNTER — Other Ambulatory Visit (HOSPITAL_COMMUNITY): Payer: Self-pay

## 2021-04-21 DIAGNOSIS — B001 Herpesviral vesicular dermatitis: Secondary | ICD-10-CM

## 2021-04-21 MED ORDER — VALACYCLOVIR HCL 1 G PO TABS
2000.0000 mg | ORAL_TABLET | Freq: Two times a day (BID) | ORAL | 0 refills | Status: AC
Start: 2021-04-21 — End: 2021-04-22
  Filled 2021-04-21: qty 4, 1d supply, fill #0

## 2021-04-21 NOTE — Progress Notes (Signed)

## 2021-04-28 ENCOUNTER — Ambulatory Visit: Payer: 59 | Admitting: Family Medicine

## 2021-05-05 ENCOUNTER — Encounter: Payer: Self-pay | Admitting: Family Medicine

## 2021-05-05 ENCOUNTER — Other Ambulatory Visit (HOSPITAL_COMMUNITY): Payer: Self-pay

## 2021-05-05 ENCOUNTER — Ambulatory Visit: Payer: 59 | Admitting: Family Medicine

## 2021-05-05 VITALS — BP 120/73 | HR 85 | Temp 98.5°F | Ht 66.0 in | Wt 223.0 lb

## 2021-05-05 DIAGNOSIS — E785 Hyperlipidemia, unspecified: Secondary | ICD-10-CM

## 2021-05-05 DIAGNOSIS — E1165 Type 2 diabetes mellitus with hyperglycemia: Secondary | ICD-10-CM

## 2021-05-05 DIAGNOSIS — Z23 Encounter for immunization: Secondary | ICD-10-CM

## 2021-05-05 DIAGNOSIS — Z6836 Body mass index (BMI) 36.0-36.9, adult: Secondary | ICD-10-CM | POA: Diagnosis not present

## 2021-05-05 LAB — LIPID PANEL
Cholesterol: 122 mg/dL (ref 0–200)
HDL: 36.6 mg/dL — ABNORMAL LOW (ref >39.00)
LDL Cholesterol: 65 mg/dL (ref 0–99)
NonHDL: 85.72
Total CHOL/HDL Ratio: 3
Triglycerides: 102 mg/dL (ref 0.0–149.0)
VLDL: 20.4 mg/dL (ref 0.0–40.0)

## 2021-05-05 LAB — COMPREHENSIVE METABOLIC PANEL
ALT: 10 U/L (ref 0–35)
AST: 7 U/L (ref 0–37)
Albumin: 4.2 g/dL (ref 3.5–5.2)
Alkaline Phosphatase: 54 U/L (ref 39–117)
BUN: 12 mg/dL (ref 6–23)
CO2: 30 mEq/L (ref 19–32)
Calcium: 9.6 mg/dL (ref 8.4–10.5)
Chloride: 104 mEq/L (ref 96–112)
Creatinine, Ser: 0.94 mg/dL (ref 0.40–1.20)
GFR: 70.31 mL/min (ref >60.00)
Glucose, Bld: 84 mg/dL (ref 70–99)
Potassium: 4.2 mEq/L (ref 3.5–5.1)
Sodium: 141 mEq/L (ref 135–145)
Total Bilirubin: 0.4 mg/dL (ref 0.2–1.2)
Total Protein: 7.2 g/dL (ref 6.0–8.3)

## 2021-05-05 LAB — HEMOGLOBIN A1C: Hgb A1c MFr Bld: 5.8 % (ref 4.6–6.5)

## 2021-05-05 MED ORDER — OZEMPIC (1 MG/DOSE) 4 MG/3ML ~~LOC~~ SOPN
1.0000 mg | PEN_INJECTOR | SUBCUTANEOUS | 2 refills | Status: DC
  Filled 2021-05-05: qty 9, 84d supply, fill #0

## 2021-05-05 NOTE — Progress Notes (Signed)
Subjective:   Chief Complaint  Patient presents with   Follow-up    Ellen Mills is a 51 y.o. female here for follow-up of diabetes.   Irina does not routinely check her sugars at home.  Patient does not require insulin.   Medications include: Ozempic 1 mg/week Diet is fair.  Exercise: walking No Cp or SOB.  Hyperlipidemia Patient presents for dyslipidemia follow up. Currently being treated with Crestor 20 mg/d and compliance with treatment thus far has been good. She denies myalgias. Diet/exercise as above.  The patient is not known to have coexisting coronary artery disease.  Past Medical History:  Diagnosis Date   Anemia    Diabetes (HCC) 07/2017   Hyperlipidemia    Obesity      Related testing: Retinal exam: Due;  Pneumovax: done  Objective:  BP 120/73    Pulse 85    Temp 98.5 F (36.9 C) (Oral)    Ht 5\' 6"  (1.676 m)    Wt 223 lb (101.2 kg)    SpO2 99%    BMI 35.99 kg/m  General:  Well developed, well nourished, in no apparent distress Skin:  Warm, no pallor or diaphoresis Lungs:  CTAB, no access msc use Cardio:  RRR, no bruits, no LE edema Psych: Age appropriate judgment and insight  Assessment:   Type 2 diabetes mellitus with hyperglycemia, without long-term current use of insulin (HCC) - Plan: Lipid panel, Hemoglobin A1c, Comprehensive metabolic panel  Dyslipidemia  Class 2 severe obesity with serious comorbidity and body mass index (BMI) of 36.0 to 36.9 in adult, unspecified obesity type (HCC), Chronic   Plan:   Chronic, stable. Cont Ozempic 1 mg/week. Ck labs today. Counseled on diet and exercise. PCV20 today. Needs to reschedule eye exam.  Chronic, stable. Ck labs. Cont Crestor 20 mg/d.  F/u in 6 mo for CPE or prn. The patient voiced understanding and agreement to the plan.  [place], DO 05/05/21 7:08 AM

## 2021-05-05 NOTE — Addendum Note (Signed)
Addended by: Sharon Seller B on: 05/05/2021 07:17 AM   Modules accepted: Orders

## 2021-05-05 NOTE — Patient Instructions (Addendum)
Give [place] 2-3 business days to get the results of your labs back.   Keep the diet clean and stay active.  I recommend getting the updated bivalent covid vaccination booster at your convenience.   Make sure to schedule your eye exam.   Let us know if you need anything.

## 2021-06-07 ENCOUNTER — Encounter: Payer: Self-pay | Admitting: Gastroenterology

## 2021-06-20 ENCOUNTER — Other Ambulatory Visit (HOSPITAL_COMMUNITY): Payer: Self-pay

## 2021-06-20 ENCOUNTER — Other Ambulatory Visit: Payer: Self-pay | Admitting: Family Medicine

## 2021-06-20 ENCOUNTER — Encounter: Payer: Self-pay | Admitting: Family Medicine

## 2021-06-20 MED ORDER — SEMAGLUTIDE (2 MG/DOSE) 8 MG/3ML ~~LOC~~ SOPN
2.0000 mg | PEN_INJECTOR | SUBCUTANEOUS | 1 refills | Status: DC
Start: 2021-06-20 — End: 2021-12-01
  Filled 2021-06-20 – 2021-06-28 (×2): qty 3, 28d supply, fill #0

## 2021-06-28 ENCOUNTER — Other Ambulatory Visit (HOSPITAL_COMMUNITY): Payer: Self-pay

## 2021-07-10 ENCOUNTER — Other Ambulatory Visit (HOSPITAL_COMMUNITY): Payer: Self-pay

## 2021-07-10 ENCOUNTER — Other Ambulatory Visit: Payer: Self-pay | Admitting: Family Medicine

## 2021-07-10 DIAGNOSIS — E119 Type 2 diabetes mellitus without complications: Secondary | ICD-10-CM

## 2021-07-10 DIAGNOSIS — E785 Hyperlipidemia, unspecified: Secondary | ICD-10-CM

## 2021-07-10 MED ORDER — ROSUVASTATIN CALCIUM 20 MG PO TABS
20.0000 mg | ORAL_TABLET | Freq: Every day | ORAL | 1 refills | Status: DC
  Filled 2021-07-10: qty 90, 90d supply, fill #0
  Filled 2021-10-04: qty 90, 90d supply, fill #1

## 2021-07-24 ENCOUNTER — Other Ambulatory Visit: Payer: Self-pay | Admitting: Family Medicine

## 2021-07-24 ENCOUNTER — Encounter: Payer: Self-pay | Admitting: Family Medicine

## 2021-07-24 ENCOUNTER — Other Ambulatory Visit (HOSPITAL_COMMUNITY): Payer: Self-pay

## 2021-07-24 MED ORDER — SEMAGLUTIDE (2 MG/DOSE) 8 MG/3ML ~~LOC~~ SOPN
2.0000 mg | PEN_INJECTOR | SUBCUTANEOUS | 0 refills | Status: DC
  Filled 2021-07-24: qty 3, 28d supply, fill #0

## 2021-08-30 ENCOUNTER — Telehealth: Payer: 59 | Admitting: Family Medicine

## 2021-08-30 DIAGNOSIS — R300 Dysuria: Secondary | ICD-10-CM | POA: Diagnosis not present

## 2021-08-31 ENCOUNTER — Other Ambulatory Visit (HOSPITAL_COMMUNITY): Payer: Self-pay

## 2021-08-31 MED ORDER — NITROFURANTOIN MONOHYD MACRO 100 MG PO CAPS
100.0000 mg | ORAL_CAPSULE | Freq: Two times a day (BID) | ORAL | 0 refills | Status: AC
  Filled 2021-08-31: qty 10, 5d supply, fill #0

## 2021-08-31 NOTE — Progress Notes (Signed)

## 2021-09-05 ENCOUNTER — Other Ambulatory Visit: Payer: Self-pay | Admitting: Family Medicine

## 2021-09-05 ENCOUNTER — Other Ambulatory Visit (HOSPITAL_COMMUNITY): Payer: Self-pay

## 2021-09-05 MED ORDER — OZEMPIC (2 MG/DOSE) 8 MG/3ML ~~LOC~~ SOPN
2.0000 mg | PEN_INJECTOR | SUBCUTANEOUS | 0 refills | Status: DC
Start: 2021-09-05 — End: 2021-09-29
  Filled 2021-09-05: qty 3, 28d supply, fill #0

## 2021-09-06 ENCOUNTER — Other Ambulatory Visit (HOSPITAL_COMMUNITY): Payer: Self-pay

## 2021-09-29 ENCOUNTER — Other Ambulatory Visit: Payer: Self-pay | Admitting: Family Medicine

## 2021-09-29 ENCOUNTER — Other Ambulatory Visit (HOSPITAL_COMMUNITY): Payer: Self-pay

## 2021-09-29 MED ORDER — OZEMPIC (2 MG/DOSE) 8 MG/3ML ~~LOC~~ SOPN
2.0000 mg | PEN_INJECTOR | SUBCUTANEOUS | 0 refills | Status: DC
  Filled 2021-09-29: qty 3, 28d supply, fill #0

## 2021-10-04 ENCOUNTER — Other Ambulatory Visit (HOSPITAL_COMMUNITY): Payer: Self-pay

## 2021-10-04 ENCOUNTER — Other Ambulatory Visit: Payer: Self-pay | Admitting: Family Medicine

## 2021-10-04 MED ORDER — GABAPENTIN 100 MG PO CAPS
200.0000 mg | ORAL_CAPSULE | Freq: Every evening | ORAL | 2 refills | Status: DC
  Filled 2021-10-04 – 2022-01-02 (×3): qty 180, 90d supply, fill #0
  Filled 2022-04-19: qty 180, 90d supply, fill #1
  Filled 2022-07-18: qty 180, 90d supply, fill #2

## 2021-10-06 ENCOUNTER — Other Ambulatory Visit (HOSPITAL_COMMUNITY): Payer: Self-pay

## 2021-10-24 ENCOUNTER — Other Ambulatory Visit: Payer: Self-pay | Admitting: Family Medicine

## 2021-10-24 ENCOUNTER — Other Ambulatory Visit (HOSPITAL_COMMUNITY): Payer: Self-pay

## 2021-10-24 MED ORDER — OZEMPIC (2 MG/DOSE) 8 MG/3ML ~~LOC~~ SOPN
2.0000 mg | PEN_INJECTOR | SUBCUTANEOUS | 0 refills | Status: DC
  Filled 2021-10-24 – 2021-10-27 (×2): qty 3, 28d supply, fill #0

## 2021-10-26 ENCOUNTER — Other Ambulatory Visit (HOSPITAL_COMMUNITY): Payer: Self-pay

## 2021-10-27 ENCOUNTER — Other Ambulatory Visit (HOSPITAL_COMMUNITY): Payer: Self-pay

## 2021-11-03 ENCOUNTER — Other Ambulatory Visit (HOSPITAL_COMMUNITY): Payer: Self-pay

## 2021-11-17 ENCOUNTER — Encounter: Payer: 59 | Admitting: Family Medicine

## 2021-12-01 ENCOUNTER — Other Ambulatory Visit (HOSPITAL_COMMUNITY): Payer: Self-pay

## 2021-12-01 ENCOUNTER — Ambulatory Visit (INDEPENDENT_AMBULATORY_CARE_PROVIDER_SITE_OTHER): Payer: 59 | Admitting: Family Medicine

## 2021-12-01 ENCOUNTER — Encounter: Payer: Self-pay | Admitting: Family Medicine

## 2021-12-01 VITALS — BP 118/70 | HR 67 | Temp 98.0°F | Ht 66.0 in | Wt 217.2 lb

## 2021-12-01 DIAGNOSIS — Z0000 Encounter for general adult medical examination without abnormal findings: Secondary | ICD-10-CM | POA: Diagnosis not present

## 2021-12-01 DIAGNOSIS — E1165 Type 2 diabetes mellitus with hyperglycemia: Secondary | ICD-10-CM

## 2021-12-01 MED ORDER — OZEMPIC (2 MG/DOSE) 8 MG/3ML ~~LOC~~ SOPN
2.0000 mg | PEN_INJECTOR | SUBCUTANEOUS | 2 refills | Status: DC
  Filled 2021-12-01: qty 9, 84d supply, fill #0
  Filled 2021-12-01: qty 3, 28d supply, fill #0
  Filled 2022-02-13: qty 3, 28d supply, fill #1
  Filled 2022-03-18: qty 3, 28d supply, fill #2
  Filled 2022-04-10 – 2022-04-19 (×2): qty 9, 84d supply, fill #3
  Filled 2022-07-18: qty 3, 28d supply, fill #4

## 2021-12-01 NOTE — Progress Notes (Signed)
Chief Complaint  Patient presents with   Annual Exam    Refill Ozempic for 3 month supply      Well Woman Ellen Mills is here for a complete physical.   Her last physical was >1 year ago.  Current diet: in general, a "healthy" diet. Current exercise: walking. Weight is intentionally decreasing and she denies fatigue out of ordinary. Seatbelt? Yes Advanced directive? No  Health Maintenance Mammogram- Yes Colon cancer screening-Yes Shingrix- Yes Tetanus- Yes Hep C screening- Yes HIV screening- Yes  Past Medical History:  Diagnosis Date   Anemia    Diabetes ([place]) 07/2017   Hyperlipidemia    Obesity      Past Surgical History:  Procedure Laterality Date   ABDOMINAL HYSTERECTOMY     LEEP      Medications  Current Outpatient Medications on File Prior to Visit  Medication Sig Dispense Refill   aspirin EC 81 MG tablet Take 81 mg by mouth daily.     Blood Glucose Monitoring Suppl (FREESTYLE LITE) w/Device KIT Use as directed 1 kit 0   COVID-19 mRNA Vac-TriS, Pfizer, (PFIZER-BIONT COVID-19 VAC-TRIS) SUSP injection Inject into the muscle. 0.3 mL 0   gabapentin (NEURONTIN) 100 MG capsule Take 2 capsules (200 mg total) by mouth at bedtime. 180 capsule 2   glucose blood (FREESTYLE LITE) test strip Use once daily as directed as needed 100 each 3   Lancets (FREESTYLE) lancets Use daily as instructed 100 each 3   meloxicam (MOBIC) 15 MG tablet Take 1 tablet (15 mg total) by mouth daily. 30 tablet 3   rosuvastatin (CRESTOR) 20 MG tablet Take 1 tablet (20 mg total) by mouth daily. 90 tablet 1   Allergies No Known Allergies  Review of Systems: Constitutional:  no unexpected weight changes Eye:  no recent significant change in vision Ear/Nose/Mouth/Throat:  Ears:  no recent change in hearing Nose/Mouth/Throat:  no complaints of nasal congestion, no sore throat Cardiovascular: no chest pain Respiratory:  no shortness of breath Gastrointestinal:  no abdominal pain, no  change in bowel habits GU:  Female: negative for dysuria or pelvic pain Musculoskeletal/Extremities:  no pain of the joints Integumentary (Skin/Breast):  no abnormal skin lesions reported Neurologic:  no headaches Endocrine:  denies fatigue  Exam BP 118/70   Pulse 67   Temp 98 F (36.7 C) (Oral)   Ht '5\' 6"'  (1.676 m)   Wt 217 lb 4 oz (98.5 kg)   SpO2 99%   BMI 35.07 kg/m  General:  well developed, well nourished, in no apparent distress Skin:  no significant moles, warts, or growths Head:  no masses, lesions, or tenderness Eyes:  pupils equal and round, sclera anicteric without injection Ears:  100% obstruction w cerumen b/l Nose:  nares patent, septum midline, mucosa normal, and no drainage or sinus tenderness Throat/Pharynx:  lips and gingiva without lesion; tongue and uvula midline; non-inflamed pharynx; no exudates or postnasal drainage Neck: neck supple without adenopathy, thyromegaly, or masses Lungs:  clear to auscultation, breath sounds equal bilaterally, no respiratory distress Cardio:  regular rate and rhythm, no LE edema Abdomen:  abdomen soft, nontender; bowel sounds normal; no masses or organomegaly Genital: Defer to GYN Musculoskeletal:  symmetrical muscle groups noted without atrophy or deformity Extremities:  no clubbing, cyanosis, or edema, no deformities, no skin discoloration Neuro:  gait normal; deep tendon reflexes normal and symmetric Psych: well oriented with normal range of affect and appropriate judgment/insight  Assessment and Plan  Well adult exam -  Plan: CBC, Comprehensive metabolic panel, Lipid panel  Type 2 diabetes mellitus with hyperglycemia, without long-term current use of insulin (HCC) - Plan: Hemoglobin A1c, Microalbumin / creatinine urine ratio   Well 51 y.o. female. Counseled on diet and exercise. Advanced directive form provided today.  Pt waiting for 2024 to get set up with ophtho.  Flu shot rec'd for Oct.  Other orders as  above. Follow up in 6 mo or prn. The patient voiced understanding and agreement to the plan.  [place], DO 12/01/21 2:48 PM

## 2021-12-01 NOTE — Patient Instructions (Signed)
Give us 2-3 business days to get the results of your labs back.   Keep the diet clean and stay active.  I recommend getting the flu shot in mid October. This suggestion would change if the CDC comes out with a different recommendation.   Please get me a copy of your advanced directive form at your convenience.   Let us know if you need anything.  

## 2021-12-12 ENCOUNTER — Other Ambulatory Visit (INDEPENDENT_AMBULATORY_CARE_PROVIDER_SITE_OTHER): Payer: 59

## 2021-12-12 DIAGNOSIS — Z0000 Encounter for general adult medical examination without abnormal findings: Secondary | ICD-10-CM | POA: Diagnosis not present

## 2021-12-12 DIAGNOSIS — E1165 Type 2 diabetes mellitus with hyperglycemia: Secondary | ICD-10-CM | POA: Diagnosis not present

## 2021-12-12 LAB — LIPID PANEL
Cholesterol: 145 mg/dL (ref 0–200)
HDL: 42.6 mg/dL (ref >39.00)
LDL Cholesterol: 83 mg/dL (ref 0–99)
NonHDL: 101.93
Total CHOL/HDL Ratio: 3
Triglycerides: 97 mg/dL (ref 0.0–149.0)
VLDL: 19.4 mg/dL (ref 0.0–40.0)

## 2021-12-12 LAB — CBC
HCT: 40.4 % (ref 36.0–46.0)
Hemoglobin: 13.2 g/dL (ref 12.0–15.0)
MCHC: 32.8 g/dL (ref 30.0–36.0)
MCV: 87.4 fl (ref 78.0–100.0)
Platelets: 374 10*3/uL (ref 150.0–400.0)
RBC: 4.62 Mil/uL (ref 3.87–5.11)
RDW: 13.7 % (ref 11.5–15.5)
WBC: 5.6 10*3/uL (ref 4.0–10.5)

## 2021-12-12 LAB — COMPREHENSIVE METABOLIC PANEL
ALT: 11 U/L (ref 0–35)
AST: 8 U/L (ref 0–37)
Albumin: 4.3 g/dL (ref 3.5–5.2)
Alkaline Phosphatase: 53 U/L (ref 39–117)
BUN: 11 mg/dL (ref 6–23)
CO2: 30 mEq/L (ref 19–32)
Calcium: 9.6 mg/dL (ref 8.4–10.5)
Chloride: 104 mEq/L (ref 96–112)
Creatinine, Ser: 0.92 mg/dL (ref 0.40–1.20)
GFR: 71.84 mL/min (ref >60.00)
Glucose, Bld: 93 mg/dL (ref 70–99)
Potassium: 4.1 mEq/L (ref 3.5–5.1)
Sodium: 139 mEq/L (ref 135–145)
Total Bilirubin: 0.4 mg/dL (ref 0.2–1.2)
Total Protein: 7.9 g/dL (ref 6.0–8.3)

## 2021-12-12 LAB — MICROALBUMIN / CREATININE URINE RATIO
Creatinine,U: 156.7 mg/dL
Microalb Creat Ratio: 0.5 mg/g (ref 0.0–30.0)
Microalb, Ur: 0.8 mg/dL (ref 0.0–1.9)

## 2021-12-12 LAB — HEMOGLOBIN A1C: Hgb A1c MFr Bld: 6.2 % (ref 4.6–6.5)

## 2022-01-02 ENCOUNTER — Other Ambulatory Visit: Payer: Self-pay | Admitting: Family Medicine

## 2022-01-02 ENCOUNTER — Other Ambulatory Visit (HOSPITAL_COMMUNITY): Payer: Self-pay

## 2022-01-02 DIAGNOSIS — E119 Type 2 diabetes mellitus without complications: Secondary | ICD-10-CM

## 2022-01-02 DIAGNOSIS — E785 Hyperlipidemia, unspecified: Secondary | ICD-10-CM

## 2022-01-02 MED ORDER — MELOXICAM 15 MG PO TABS
15.0000 mg | ORAL_TABLET | Freq: Every day | ORAL | 3 refills | Status: DC
  Filled 2022-01-02: qty 30, 30d supply, fill #0
  Filled 2022-02-13: qty 30, 30d supply, fill #1
  Filled 2022-03-18: qty 30, 30d supply, fill #2
  Filled 2022-04-19: qty 30, 30d supply, fill #3

## 2022-01-02 MED ORDER — ROSUVASTATIN CALCIUM 20 MG PO TABS
20.0000 mg | ORAL_TABLET | Freq: Every day | ORAL | 1 refills | Status: DC
  Filled 2022-01-02: qty 90, 90d supply, fill #0
  Filled 2022-03-18: qty 90, 90d supply, fill #1

## 2022-01-25 ENCOUNTER — Telehealth: Payer: 59 | Admitting: Family Medicine

## 2022-01-25 ENCOUNTER — Other Ambulatory Visit (HOSPITAL_COMMUNITY): Payer: Self-pay

## 2022-01-25 DIAGNOSIS — R300 Dysuria: Secondary | ICD-10-CM | POA: Diagnosis not present

## 2022-01-25 MED ORDER — NITROFURANTOIN MONOHYD MACRO 100 MG PO CAPS
100.0000 mg | ORAL_CAPSULE | Freq: Two times a day (BID) | ORAL | 0 refills | Status: AC
  Filled 2022-01-25: qty 10, 5d supply, fill #0

## 2022-01-25 NOTE — Progress Notes (Signed)

## 2022-02-13 ENCOUNTER — Other Ambulatory Visit (HOSPITAL_COMMUNITY): Payer: Self-pay

## 2022-03-05 ENCOUNTER — Encounter: Payer: Self-pay | Admitting: Family Medicine

## 2022-03-19 ENCOUNTER — Other Ambulatory Visit (HOSPITAL_COMMUNITY): Payer: Self-pay

## 2022-03-21 ENCOUNTER — Other Ambulatory Visit (HOSPITAL_COMMUNITY): Payer: Self-pay

## 2022-04-10 ENCOUNTER — Other Ambulatory Visit (HOSPITAL_COMMUNITY): Payer: Self-pay

## 2022-04-16 ENCOUNTER — Other Ambulatory Visit (HOSPITAL_COMMUNITY): Payer: Self-pay

## 2022-04-19 ENCOUNTER — Other Ambulatory Visit: Payer: Self-pay

## 2022-04-19 ENCOUNTER — Other Ambulatory Visit (HOSPITAL_COMMUNITY): Payer: Self-pay

## 2022-04-19 ENCOUNTER — Other Ambulatory Visit: Payer: Self-pay | Admitting: Family Medicine

## 2022-04-19 DIAGNOSIS — E119 Type 2 diabetes mellitus without complications: Secondary | ICD-10-CM

## 2022-04-19 DIAGNOSIS — E785 Hyperlipidemia, unspecified: Secondary | ICD-10-CM

## 2022-04-19 MED ORDER — ROSUVASTATIN CALCIUM 20 MG PO TABS
20.0000 mg | ORAL_TABLET | Freq: Every day | ORAL | 1 refills | Status: DC
  Filled 2022-04-19 – 2022-07-03 (×2): qty 90, 90d supply, fill #0
  Filled 2022-10-05: qty 90, 90d supply, fill #1

## 2022-05-14 ENCOUNTER — Other Ambulatory Visit: Payer: Self-pay | Admitting: Obstetrics and Gynecology

## 2022-05-14 DIAGNOSIS — Z1231 Encounter for screening mammogram for malignant neoplasm of breast: Secondary | ICD-10-CM

## 2022-06-04 ENCOUNTER — Ambulatory Visit: Payer: 59 | Admitting: Family Medicine

## 2022-06-04 ENCOUNTER — Encounter: Payer: Self-pay | Admitting: Family Medicine

## 2022-06-04 ENCOUNTER — Other Ambulatory Visit: Payer: Self-pay | Admitting: Family Medicine

## 2022-06-04 VITALS — BP 111/70 | HR 84 | Temp 98.7°F | Ht 66.0 in | Wt 213.5 lb

## 2022-06-04 DIAGNOSIS — J069 Acute upper respiratory infection, unspecified: Secondary | ICD-10-CM

## 2022-06-04 DIAGNOSIS — E1165 Type 2 diabetes mellitus with hyperglycemia: Secondary | ICD-10-CM

## 2022-06-04 DIAGNOSIS — E785 Hyperlipidemia, unspecified: Secondary | ICD-10-CM | POA: Diagnosis not present

## 2022-06-04 LAB — LIPID PANEL
Cholesterol: 118 mg/dL (ref 0–200)
HDL: 38 mg/dL — ABNORMAL LOW
LDL Cholesterol: 61 mg/dL (ref 0–99)
NonHDL: 79.73
Total CHOL/HDL Ratio: 3
Triglycerides: 93 mg/dL (ref 0.0–149.0)
VLDL: 18.6 mg/dL (ref 0.0–40.0)

## 2022-06-04 LAB — COMPREHENSIVE METABOLIC PANEL WITH GFR
ALT: 10 U/L (ref 0–35)
AST: 6 U/L (ref 0–37)
Albumin: 4.1 g/dL (ref 3.5–5.2)
Alkaline Phosphatase: 58 U/L (ref 39–117)
BUN: 12 mg/dL (ref 6–23)
CO2: 29 meq/L (ref 19–32)
Calcium: 9.7 mg/dL (ref 8.4–10.5)
Chloride: 105 meq/L (ref 96–112)
Creatinine, Ser: 0.9 mg/dL (ref 0.40–1.20)
GFR: 73.52 mL/min
Glucose, Bld: 84 mg/dL (ref 70–99)
Potassium: 4.7 meq/L (ref 3.5–5.1)
Sodium: 142 meq/L (ref 135–145)
Total Bilirubin: 0.5 mg/dL (ref 0.2–1.2)
Total Protein: 7.4 g/dL (ref 6.0–8.3)

## 2022-06-04 LAB — HEMOGLOBIN A1C: Hgb A1c MFr Bld: 5.9 % (ref 4.6–6.5)

## 2022-06-04 NOTE — Progress Notes (Signed)
Subjective:   Chief Complaint  Patient presents with   Follow-up    Diabetes     Ellen Mills is a 52 y.o. female here for follow-up of diabetes.   Ellen Mills's self monitored glucose range is 80-90's.  Patient denies hypoglycemic reactions. She checks her glucose levels 1 time(s) per week. Patient does not require insulin.   Medications include: Ozempic 2 mg/d,  Diet is healthy.  Exercise: walking  Hyperlipidemia Patient presents for dyslipidemia follow up. Currently being treated with Crestor 20 mg/d and compliance with treatment thus far has been good. She denies myalgias. Diet/exercise as above.  The patient is not known to have coexisting coronary artery disease. No Cp or SOB.   URI Duration: 1 week  Associated symptoms: sinus congestion, rhinorrhea, and scratchy throat Denies: sinus pain, itchy watery eyes, ear pain, ear drainage, sore throat, wheezing, shortness of breath, myalgia, and fevers, coughing Treatment to date: Tylenol Sick contacts: Yes; grandson  Past Medical History:  Diagnosis Date   Anemia    Diabetes ([place]) 07/2017   Hyperlipidemia    Obesity      Related testing: Retinal exam: Due Pneumovax: done  Objective:  BP 111/70 (BP Location: Left Arm, Patient Position: Sitting, Cuff Size: Large)   Pulse 84   Temp 98.7 F (37.1 C) (Oral)   Ht '5\' 6"'$  (1.676 m)   Wt 213 lb 8 oz (96.8 kg)   SpO2 98%   BMI 34.46 kg/m  General:  Well developed, well nourished, in no apparent distress HEENT: Ears are 30% obstructed with cerumen bilaterally.  No otorrhea or erythema, TMs negative bilaterally, no sinus TTP bilaterally, PERRLA, sclera white, nares are patent without rhinorrhea, MMM, no pharyngeal exudate or erythema Skin:  Warm, no pallor or diaphoresis on exposed surfaces.  Lungs:  CTAB, no access msc use Cardio:  RRR, no bruits, no LE edema Psych: Age appropriate judgment and insight  Assessment:   Type 2 diabetes mellitus with hyperglycemia,  without long-term current use of insulin (HCC) - Plan: Lipid panel, Hemoglobin A1c, Comprehensive metabolic panel, Ambulatory referral to Ophthalmology  Dyslipidemia  Viral URI   Plan:   Chronic, stable.  Continue Ozempic 2 mg weekly.  Referral to ophtho placed, the previous doc she was seeing is out of network. Counseled on diet and exercise. Chronic, stable.  Continue Crestor 20 mg daily. Reassurance given.  If no improvement the next few days, she will send me a MyChart message. F/u in 6 mo. The patient voiced understanding and agreement to the plan.  [place], DO 06/04/22 7:49 AM

## 2022-06-04 NOTE — Patient Instructions (Addendum)
Give [place] 2-3 business days to get the results of your labs back.   Keep the diet clean and stay active.  Continue to push fluids, practice good hand hygiene, and cover your mouth if you cough.  If you start having fevers, shaking or shortness of breath, seek immediate care.  Let us know if you need anything.

## 2022-06-05 ENCOUNTER — Other Ambulatory Visit (HOSPITAL_COMMUNITY): Payer: Self-pay

## 2022-06-05 MED ORDER — MELOXICAM 15 MG PO TABS
15.0000 mg | ORAL_TABLET | Freq: Every day | ORAL | 3 refills | Status: DC
  Filled 2022-06-05: qty 30, 30d supply, fill #0
  Filled 2022-07-03: qty 30, 30d supply, fill #1
  Filled 2022-08-21: qty 30, 30d supply, fill #2
  Filled 2022-10-05: qty 30, 30d supply, fill #3

## 2022-06-27 ENCOUNTER — Ambulatory Visit
Admission: RE | Admit: 2022-06-27 | Discharge: 2022-06-27 | Disposition: A | Discharge status: Home / Self Care | Payer: 59 | Source: Ambulatory Visit | Attending: Obstetrics and Gynecology | Admitting: Obstetrics and Gynecology

## 2022-06-27 DIAGNOSIS — Z1231 Encounter for screening mammogram for malignant neoplasm of breast: Secondary | ICD-10-CM

## 2022-07-03 ENCOUNTER — Other Ambulatory Visit: Payer: Self-pay

## 2022-07-03 ENCOUNTER — Other Ambulatory Visit (HOSPITAL_COMMUNITY): Payer: Self-pay

## 2022-07-18 ENCOUNTER — Other Ambulatory Visit (HOSPITAL_COMMUNITY): Payer: Self-pay

## 2022-08-21 ENCOUNTER — Other Ambulatory Visit: Payer: Self-pay | Admitting: Family Medicine

## 2022-08-22 ENCOUNTER — Other Ambulatory Visit: Payer: Self-pay

## 2022-08-22 ENCOUNTER — Other Ambulatory Visit (HOSPITAL_COMMUNITY): Payer: Self-pay

## 2022-08-22 MED ORDER — OZEMPIC (2 MG/DOSE) 8 MG/3ML ~~LOC~~ SOPN
2.0000 mg | PEN_INJECTOR | SUBCUTANEOUS | 2 refills | Status: DC
  Filled 2022-08-22: qty 9, 84d supply, fill #0
  Filled 2022-11-09: qty 9, 84d supply, fill #1

## 2022-10-05 ENCOUNTER — Other Ambulatory Visit: Payer: Self-pay | Admitting: Family Medicine

## 2022-10-05 ENCOUNTER — Other Ambulatory Visit (HOSPITAL_COMMUNITY): Payer: Self-pay

## 2022-10-05 MED ORDER — GABAPENTIN 100 MG PO CAPS
200.0000 mg | ORAL_CAPSULE | Freq: Every evening | ORAL | 2 refills | Status: DC
  Filled 2022-10-05: qty 180, 90d supply, fill #0
  Filled 2023-02-06: qty 180, 90d supply, fill #1
  Filled 2023-05-08: qty 180, 90d supply, fill #2

## 2022-10-10 ENCOUNTER — Other Ambulatory Visit (HOSPITAL_COMMUNITY): Payer: Self-pay

## 2022-10-24 DIAGNOSIS — H5203 Hypermetropia, bilateral: Secondary | ICD-10-CM | POA: Diagnosis not present

## 2022-10-24 DIAGNOSIS — H52203 Unspecified astigmatism, bilateral: Secondary | ICD-10-CM | POA: Diagnosis not present

## 2022-10-24 LAB — HM DIABETES EYE EXAM

## 2022-11-07 ENCOUNTER — Encounter (INDEPENDENT_AMBULATORY_CARE_PROVIDER_SITE_OTHER): Payer: Self-pay

## 2022-11-09 ENCOUNTER — Other Ambulatory Visit: Payer: Self-pay

## 2022-11-12 ENCOUNTER — Telehealth: Payer: 59 | Admitting: Nurse Practitioner

## 2022-11-12 ENCOUNTER — Other Ambulatory Visit (HOSPITAL_COMMUNITY): Payer: Self-pay

## 2022-11-12 DIAGNOSIS — R3989 Other symptoms and signs involving the genitourinary system: Secondary | ICD-10-CM

## 2022-11-12 MED ORDER — CEPHALEXIN 500 MG PO CAPS
500.0000 mg | ORAL_CAPSULE | Freq: Two times a day (BID) | ORAL | 0 refills | Status: AC
  Filled 2022-11-12: qty 14, 7d supply, fill #0

## 2022-11-12 NOTE — Progress Notes (Signed)

## 2022-12-04 ENCOUNTER — Other Ambulatory Visit (HOSPITAL_COMMUNITY): Payer: Self-pay

## 2022-12-04 ENCOUNTER — Other Ambulatory Visit: Payer: Self-pay | Admitting: Family Medicine

## 2022-12-04 DIAGNOSIS — M25511 Pain in right shoulder: Secondary | ICD-10-CM | POA: Diagnosis not present

## 2022-12-04 DIAGNOSIS — M25512 Pain in left shoulder: Secondary | ICD-10-CM | POA: Diagnosis not present

## 2022-12-04 DIAGNOSIS — M7541 Impingement syndrome of right shoulder: Secondary | ICD-10-CM | POA: Diagnosis not present

## 2022-12-04 DIAGNOSIS — M7542 Impingement syndrome of left shoulder: Secondary | ICD-10-CM | POA: Diagnosis not present

## 2022-12-04 DIAGNOSIS — M13811 Other specified arthritis, right shoulder: Secondary | ICD-10-CM | POA: Diagnosis not present

## 2022-12-04 DIAGNOSIS — M13812 Other specified arthritis, left shoulder: Secondary | ICD-10-CM | POA: Diagnosis not present

## 2022-12-04 MED ORDER — MELOXICAM 15 MG PO TABS
15.0000 mg | ORAL_TABLET | Freq: Every day | ORAL | 3 refills | Status: DC
  Filled 2022-12-04: qty 30, 30d supply, fill #0

## 2022-12-06 ENCOUNTER — Other Ambulatory Visit (HOSPITAL_COMMUNITY): Payer: Self-pay

## 2022-12-07 ENCOUNTER — Other Ambulatory Visit: Payer: Self-pay

## 2022-12-07 ENCOUNTER — Encounter: Payer: Self-pay | Admitting: Family Medicine

## 2022-12-07 ENCOUNTER — Other Ambulatory Visit (HOSPITAL_COMMUNITY): Payer: Self-pay

## 2022-12-07 ENCOUNTER — Ambulatory Visit (INDEPENDENT_AMBULATORY_CARE_PROVIDER_SITE_OTHER): Payer: 59 | Admitting: Family Medicine

## 2022-12-07 VITALS — BP 120/70 | HR 72 | Temp 98.0°F | Ht 66.0 in | Wt 209.2 lb

## 2022-12-07 DIAGNOSIS — E1165 Type 2 diabetes mellitus with hyperglycemia: Secondary | ICD-10-CM | POA: Diagnosis not present

## 2022-12-07 DIAGNOSIS — M25512 Pain in left shoulder: Secondary | ICD-10-CM | POA: Diagnosis not present

## 2022-12-07 DIAGNOSIS — Z7985 Long-term (current) use of injectable non-insulin antidiabetic drugs: Secondary | ICD-10-CM | POA: Diagnosis not present

## 2022-12-07 DIAGNOSIS — Z0000 Encounter for general adult medical examination without abnormal findings: Secondary | ICD-10-CM

## 2022-12-07 DIAGNOSIS — M25511 Pain in right shoulder: Secondary | ICD-10-CM | POA: Diagnosis not present

## 2022-12-07 LAB — COMPREHENSIVE METABOLIC PANEL
ALT: 9 U/L (ref 0–35)
AST: 5 U/L (ref 0–37)
Albumin: 4.1 g/dL (ref 3.5–5.2)
Alkaline Phosphatase: 52 U/L (ref 39–117)
BUN: 13 mg/dL (ref 6–23)
CO2: 27 mEq/L (ref 19–32)
Calcium: 9.3 mg/dL (ref 8.4–10.5)
Chloride: 106 mEq/L (ref 96–112)
Creatinine, Ser: 0.83 mg/dL (ref 0.40–1.20)
GFR: 80.73 mL/min (ref >60.00)
Glucose, Bld: 78 mg/dL (ref 70–99)
Potassium: 4 mEq/L (ref 3.5–5.1)
Sodium: 140 mEq/L (ref 135–145)
Total Bilirubin: 0.5 mg/dL (ref 0.2–1.2)
Total Protein: 7 g/dL (ref 6.0–8.3)

## 2022-12-07 LAB — LIPID PANEL
Cholesterol: 116 mg/dL (ref 0–200)
HDL: 38.5 mg/dL — ABNORMAL LOW (ref >39.00)
LDL Cholesterol: 60 mg/dL (ref 0–99)
NonHDL: 77.78
Total CHOL/HDL Ratio: 3
Triglycerides: 87 mg/dL (ref 0.0–149.0)
VLDL: 17.4 mg/dL (ref 0.0–40.0)

## 2022-12-07 LAB — CBC
HCT: 39.9 % (ref 36.0–46.0)
Hemoglobin: 12.7 g/dL (ref 12.0–15.0)
MCHC: 31.7 g/dL (ref 30.0–36.0)
MCV: 87.8 fl (ref 78.0–100.0)
Platelets: 332 10*3/uL (ref 150.0–400.0)
RBC: 4.55 Mil/uL (ref 3.87–5.11)
RDW: 13.4 % (ref 11.5–15.5)
WBC: 5.3 10*3/uL (ref 4.0–10.5)

## 2022-12-07 LAB — HEMOGLOBIN A1C: Hgb A1c MFr Bld: 5.8 % (ref 4.6–6.5)

## 2022-12-07 MED ORDER — IBUPROFEN 600 MG PO TABS
600.0000 mg | ORAL_TABLET | Freq: Three times a day (TID) | ORAL | 0 refills | Status: DC | PRN
  Filled 2022-12-07: qty 60, 20d supply, fill #0

## 2022-12-07 MED ORDER — METHYLPREDNISOLONE ACETATE 40 MG/ML IJ SUSP
40.0000 mg | Freq: Once | INTRAMUSCULAR | Status: AC
  Administered 2022-12-07: 40 mg via INTRA_ARTICULAR

## 2022-12-07 MED ORDER — TIRZEPATIDE 10 MG/0.5ML ~~LOC~~ SOAJ
10.0000 mg | SUBCUTANEOUS | 0 refills | Status: DC
  Filled 2022-12-07: qty 2, 28d supply, fill #0

## 2022-12-07 MED ORDER — TIRZEPATIDE 12.5 MG/0.5ML ~~LOC~~ SOAJ
12.5000 mg | SUBCUTANEOUS | 0 refills | Status: AC
  Filled 2022-12-07: qty 2, 28d supply, fill #0

## 2022-12-07 MED ORDER — TIRZEPATIDE 15 MG/0.5ML ~~LOC~~ SOAJ
15.0000 mg | SUBCUTANEOUS | 2 refills | Status: DC
  Filled 2022-12-07: qty 6, 84d supply, fill #0
  Filled 2023-02-06: qty 2, 28d supply, fill #0
  Filled 2023-02-27: qty 6, 84d supply, fill #1
  Filled 2023-02-28 – 2023-03-04 (×2): qty 2, 28d supply, fill #1
  Filled 2023-03-29: qty 2, 28d supply, fill #2
  Filled 2023-04-29: qty 2, 28d supply, fill #3
  Filled 2023-04-29: qty 6, 84d supply, fill #3
  Filled 2023-07-11: qty 6, 84d supply, fill #4

## 2022-12-07 NOTE — Patient Instructions (Addendum)
Give [place] 2-3 business days to get the results of your labs back.   Keep the diet clean and stay active.  Please consider adding some weight resistance exercise to your routine. Consider yoga as well.   We don't do the shots more than every 3 months.   I recommend getting the flu shot in mid October. This suggestion would change if the CDC comes out with a different recommendation.   Heat (pad or rice pillow in microwave) over affected area, 10-15 minutes twice daily.   Ice/cold pack over area for 10-15 min twice daily.  OK to take Tylenol 1000 mg (2 extra strength tabs) or 975 mg (3 regular strength tabs) every 6 hours as needed.  Let us know if you need anything.

## 2022-12-07 NOTE — Progress Notes (Signed)
Chief Complaint  Patient presents with   Annual Exam     Well Woman Ellen Mills is here for a complete physical.   Her last physical was >1 year ago.  Current diet: in general, a "healthy" diet. Current exercise: walking. Weight is intentionally decreasing and she denies fatigue out of ordinary. No LMP recorded. Patient has had a hysterectomy. Seatbelt? Yes Advanced directive? No  Health Maintenance Pap/HPV- Yes Mammogram- Yes Colon cancer screening-Yes Shingrix- Yes Tetanus- Yes Hep C screening- Yes HIV screening- Yes  B/l shoulder pain Chronic history of bilateral shoulder pain.  Slight decreased range of motion associated.  Steadily getting worse.  She was told she needs surgery which she is going to try to prolong as long as possible.  No recent injury or change in activity.  Imaging through Ortho showed arthritis.  No neurologic signs or symptoms.  Denies bruising, redness, swelling.  Taking Tylenol and ibuprofen at home with limited relief.  Past Medical History:  Diagnosis Date   Anemia    Diabetes (HCC) 07/2017   Hyperlipidemia    Obesity      Past Surgical History:  Procedure Laterality Date   ABDOMINAL HYSTERECTOMY     LEEP      Medications  Current Outpatient Medications on File Prior to Visit  Medication Sig Dispense Refill   traMADol (ULTRAM) 50 MG tablet Take 50 mg by mouth every 6 (six) hours as needed.     aspirin EC 81 MG tablet Take 81 mg by mouth daily.     Blood Glucose Monitoring Suppl (FREESTYLE LITE) w/Device KIT Use as directed 1 kit 0   gabapentin (NEURONTIN) 100 MG capsule Take 2 capsules (200 mg total) by mouth at bedtime. 180 capsule 2   glucose blood (FREESTYLE LITE) test strip Use once daily as directed as needed 100 each 3   Lancets (FREESTYLE) lancets Use daily as instructed 100 each 3   rosuvastatin (CRESTOR) 20 MG tablet Take 1 tablet (20 mg total) by mouth daily. 90 tablet 1   Allergies No Known Allergies  Review of  Systems: Constitutional:  no unexpected weight changes Eye:  no recent significant change in vision Ear/Nose/Mouth/Throat:  Ears:  no recent change in hearing Nose/Mouth/Throat:  no complaints of nasal congestion, no sore throat Cardiovascular: no chest pain Respiratory:  no shortness of breath Gastrointestinal:  no abdominal pain, no change in bowel habits GU:  Female: negative for dysuria or pelvic pain Musculoskeletal/Extremities: +b/l shoulder pain Integumentary (Skin/Breast):  no abnormal skin lesions reported Neurologic:  no headaches Endocrine:  denies fatigue  Exam BP 120/70 (BP Location: Left Arm, Patient Position: Sitting, Cuff Size: Large)   Pulse 72   Temp 98 F (36.7 C) (Oral)   Ht 5\' 6"  (1.676 m)   Wt 209 lb 4 oz (94.9 kg)   SpO2 99%   BMI 33.77 kg/m  General:  well developed, well nourished, in no apparent distress Skin:  no significant moles, warts, or growths Head:  no masses, lesions, or tenderness Eyes:  pupils equal and round, sclera anicteric without injection Ears:  canals without lesions, TMs shiny without retraction, no obvious effusion, no erythema Nose:  nares patent, mucosa normal, and no drainage  Throat/Pharynx:  lips and gingiva without lesion; tongue and uvula midline; non-inflamed pharynx; no exudates or postnasal drainage Neck: neck supple without adenopathy, thyromegaly, or masses Lungs:  clear to auscultation, breath sounds equal bilaterally, no respiratory distress Cardio:  regular rate and rhythm, no LE edema  Abdomen:  abdomen soft, nontender; bowel sounds normal; no masses or organomegaly Genital: Defer to GYN Musculoskeletal: + Neer's bilaterally, decreased active/passive forward flexion bilaterally, empty can when equivocal, negative Hawkins, speeds, liftoff, symmetrical muscle groups noted without atrophy or deformity Extremities:  no clubbing, cyanosis, or edema, no deformities, no skin discoloration Neuro:  gait normal; deep tendon  reflexes normal and symmetric Psych: well oriented with normal range of affect and appropriate judgment/insight  Procedure Note; Shoulder joint injection Informed consent obtained. The area was palpated, an area was marked postero-lateral to the acromion with alcohol x1. A 27-gauge needle, while aiming towards the coracoid process, was used to enter the joint posteriorally with ease. 40 mg of Depo-Medrol with 2 mL of 1% lidocaine was injected. A Band-Aid was placed. This was repeated on the contralateral side. The patient tolerated the procedure well. There were no complications noted.  Assessment and Plan  Well adult exam - Plan: CBC, Comprehensive metabolic panel, Lipid panel  Type 2 diabetes mellitus with hyperglycemia, without long-term current use of insulin (HCC) - Plan: Hemoglobin A1c, Microalbumin / creatinine urine ratio  Bilateral shoulder pain, unspecified chronicity - Plan: methylPREDNISolone acetate (DEPO-MEDROL) injection 40 mg, methylPREDNISolone acetate (DEPO-MEDROL) injection 40 mg, PR ARTHROCENTESIS ASPIR&/INJ MAJOR JT/BURSA W/O [place]   Well 52 y.o. female. Counseled on diet and exercise. Advanced directive form provided today.  Shoulders: Injections today bilaterally.  If these do not help, she may need to see about having surgery. Other orders as above. Follow up in 6 mo. The patient voiced understanding and agreement to the plan.  Jilda Roche [place], DO 12/07/22 12:29 PM

## 2022-12-31 ENCOUNTER — Other Ambulatory Visit: Payer: Self-pay | Admitting: Family Medicine

## 2022-12-31 DIAGNOSIS — E785 Hyperlipidemia, unspecified: Secondary | ICD-10-CM

## 2022-12-31 DIAGNOSIS — E119 Type 2 diabetes mellitus without complications: Secondary | ICD-10-CM

## 2023-01-01 ENCOUNTER — Other Ambulatory Visit: Payer: Self-pay

## 2023-01-01 ENCOUNTER — Other Ambulatory Visit (HOSPITAL_COMMUNITY): Payer: Self-pay

## 2023-01-01 MED ORDER — ROSUVASTATIN CALCIUM 20 MG PO TABS
20.0000 mg | ORAL_TABLET | Freq: Every day | ORAL | 1 refills | Status: DC
  Filled 2023-01-01: qty 90, 90d supply, fill #0
  Filled 2023-04-07: qty 90, 90d supply, fill #1

## 2023-01-01 MED ORDER — MOUNJARO 10 MG/0.5ML ~~LOC~~ SOAJ
10.0000 mg | SUBCUTANEOUS | 0 refills | Status: AC
  Filled 2023-01-01: qty 2, 28d supply, fill #0

## 2023-01-01 MED ORDER — IBUPROFEN 600 MG PO TABS
600.0000 mg | ORAL_TABLET | Freq: Three times a day (TID) | ORAL | 0 refills | Status: DC | PRN
  Filled 2023-01-01: qty 60, 20d supply, fill #0

## 2023-01-01 NOTE — Telephone Encounter (Signed)
Called the patient and no answer.

## 2023-01-02 ENCOUNTER — Telehealth: Payer: Self-pay | Admitting: Family Medicine

## 2023-01-02 NOTE — Telephone Encounter (Signed)
PT returned call from robin. Advised of message. Pt said it was an error and she realized it was a different provider so please disregard.

## 2023-01-02 NOTE — Telephone Encounter (Signed)
Thanks

## 2023-01-02 NOTE — Telephone Encounter (Signed)
Called but no answer/mailbox full. Will deny medication due to reason PCP does not fill

## 2023-01-15 ENCOUNTER — Other Ambulatory Visit (HOSPITAL_COMMUNITY): Payer: Self-pay

## 2023-02-07 ENCOUNTER — Other Ambulatory Visit (HOSPITAL_COMMUNITY): Payer: Self-pay

## 2023-02-07 ENCOUNTER — Other Ambulatory Visit: Payer: Self-pay

## 2023-02-27 ENCOUNTER — Other Ambulatory Visit (HOSPITAL_COMMUNITY): Payer: Self-pay

## 2023-03-04 ENCOUNTER — Other Ambulatory Visit (HOSPITAL_COMMUNITY): Payer: Self-pay

## 2023-03-16 IMAGING — MG MM DIGITAL SCREENING BILAT W/ TOMO AND CAD
8 series · 8 of 24 positions shown · non-contrast
Comparison: Previous exam(s).

CLINICAL DATA: Screening.

EXAM:
DIGITAL SCREENING BILATERAL MAMMOGRAM WITH TOMOSYNTHESIS AND CAD
TECHNIQUE: Bilateral screening digital craniocaudal and mediolateral oblique
mammograms were obtained. Bilateral screening digital breast
tomosynthesis was performed. The images were evaluated with
computer-aided detection.

[L CC synth-2D]
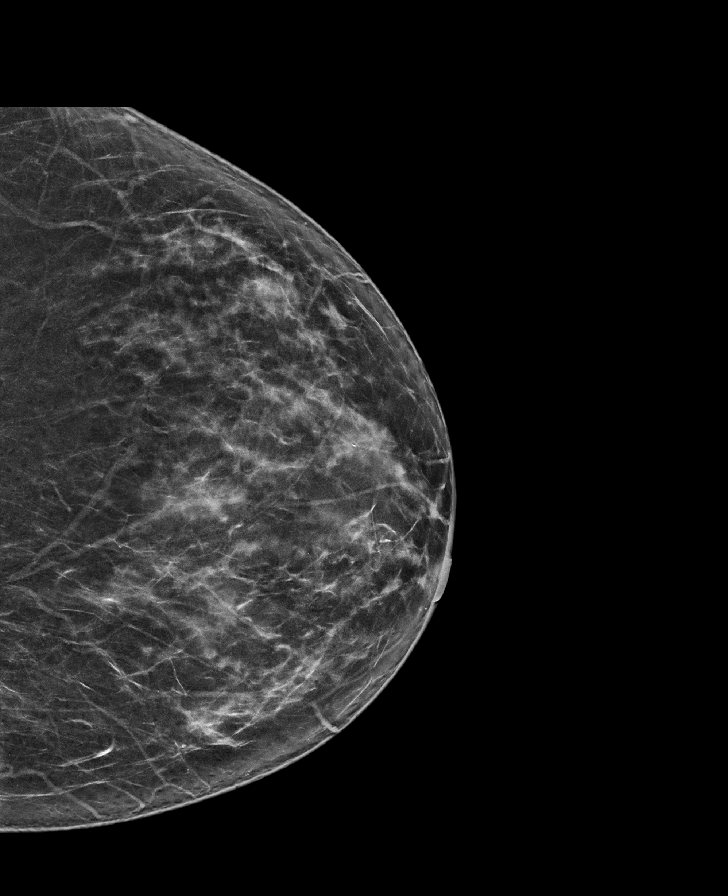

[R CC synth-2D]
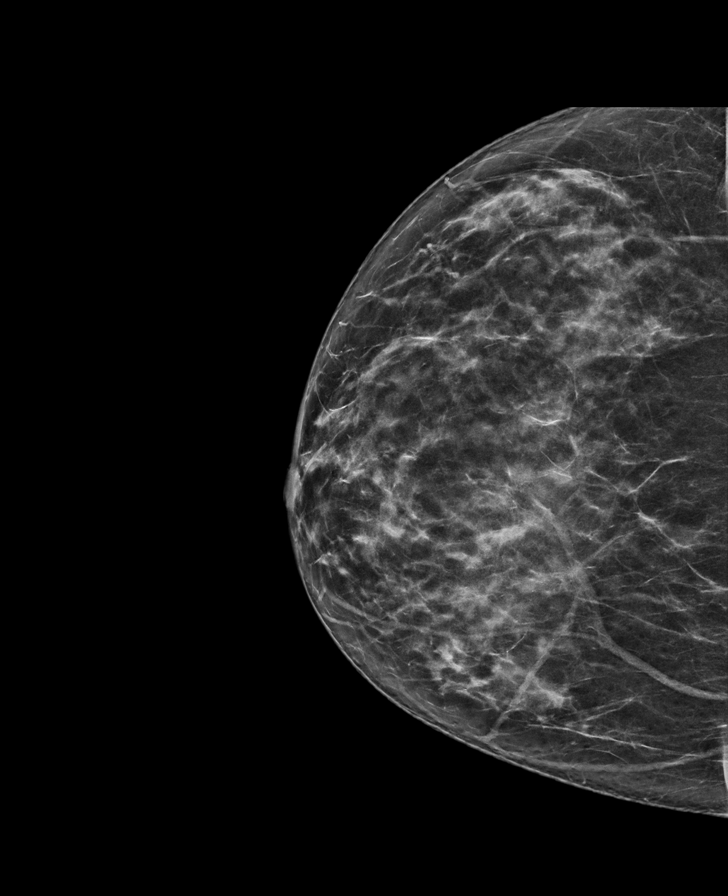

[R MLO synth-2D]
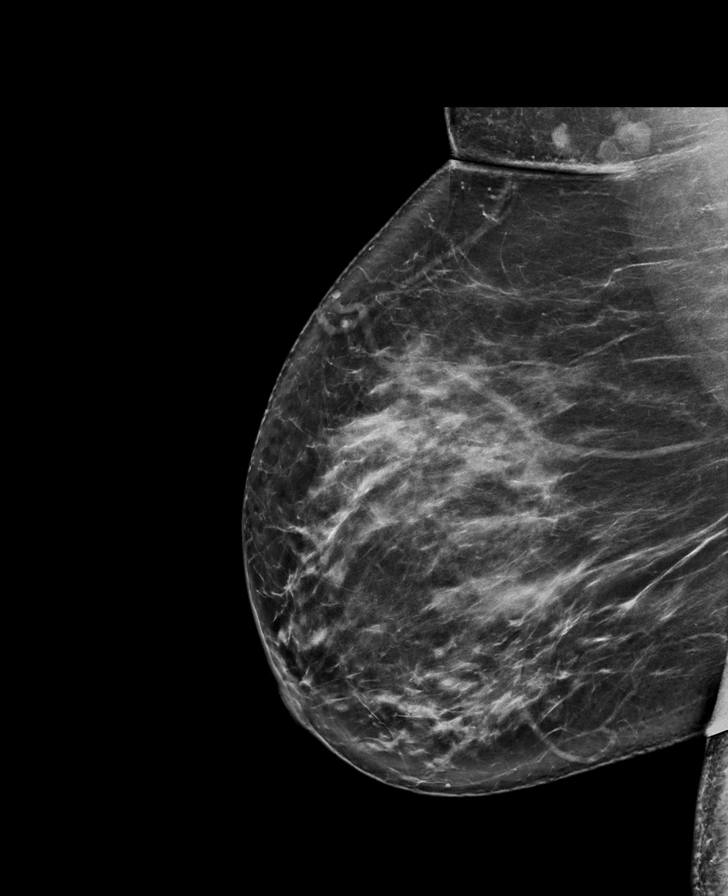

[L MLO synth-2D]
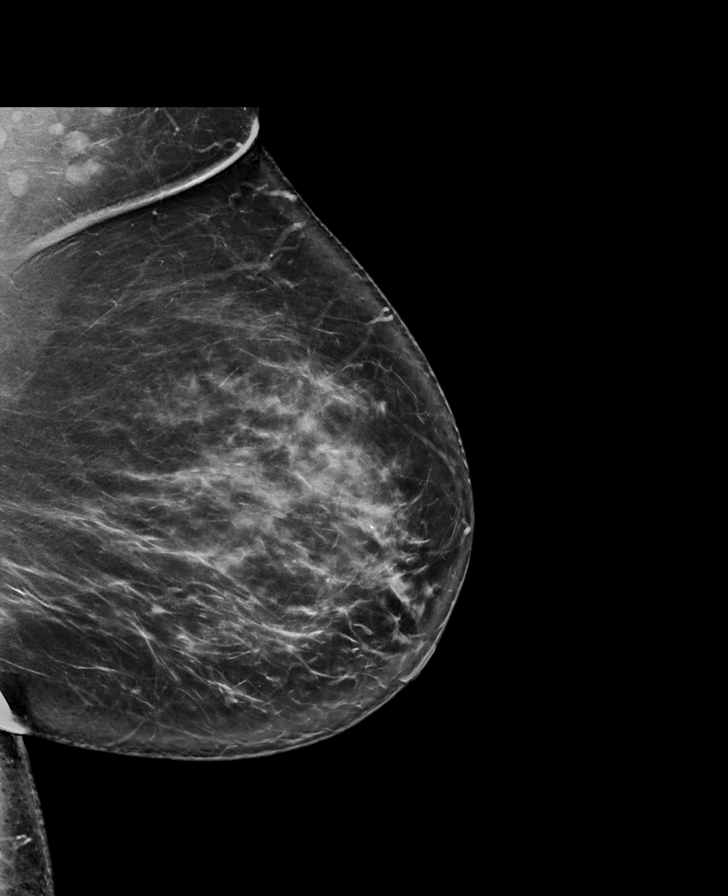

[L CC tomo · tomo slice 38/75.0]
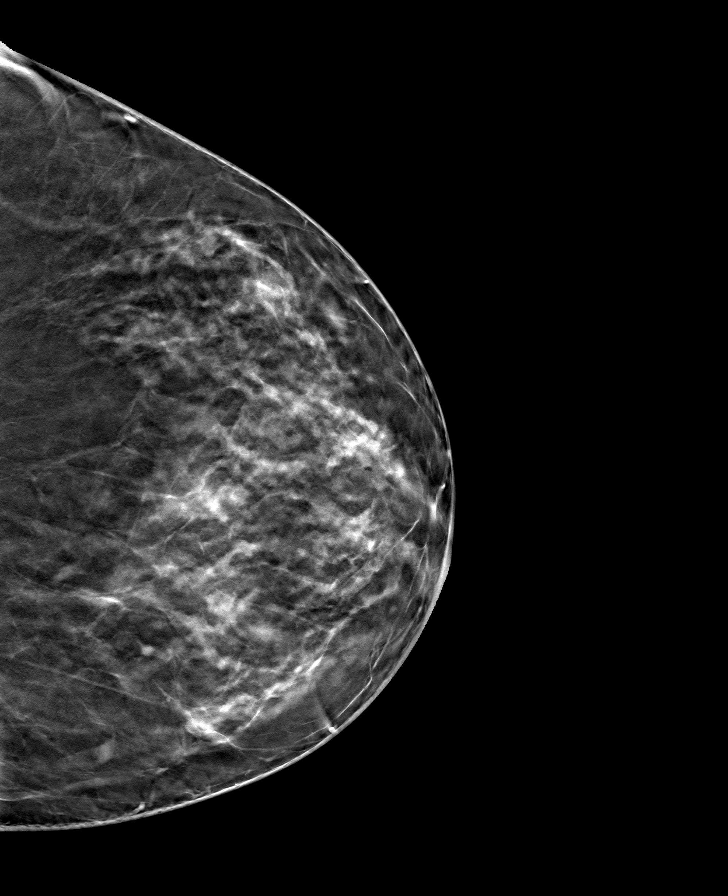

[R MLO tomo · tomo slice 45/89.0]
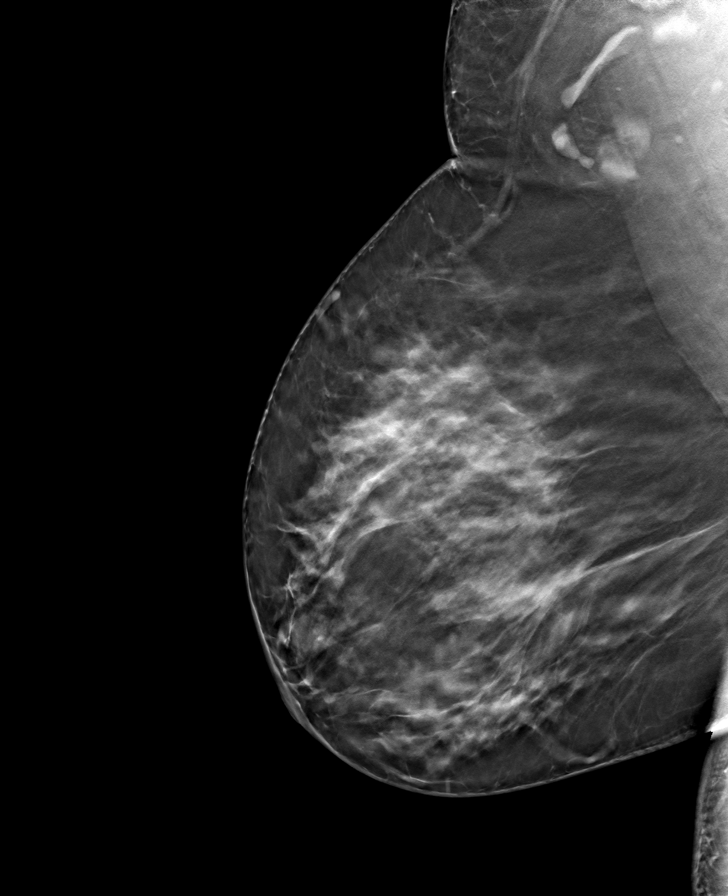

[L MLO tomo · tomo slice 47/94.0]
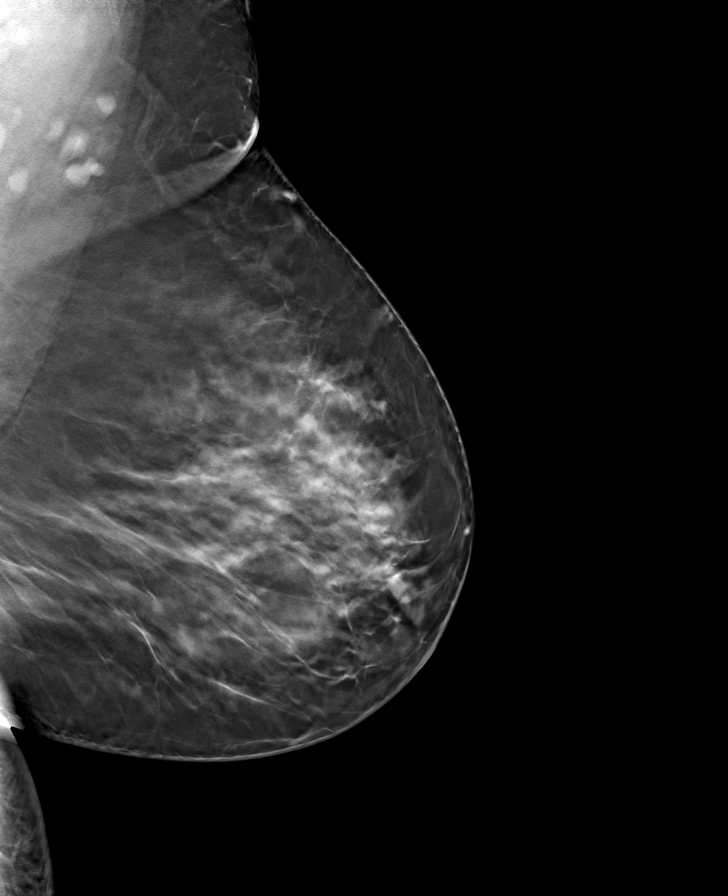

[R CC tomo · tomo slice 37/72.0]
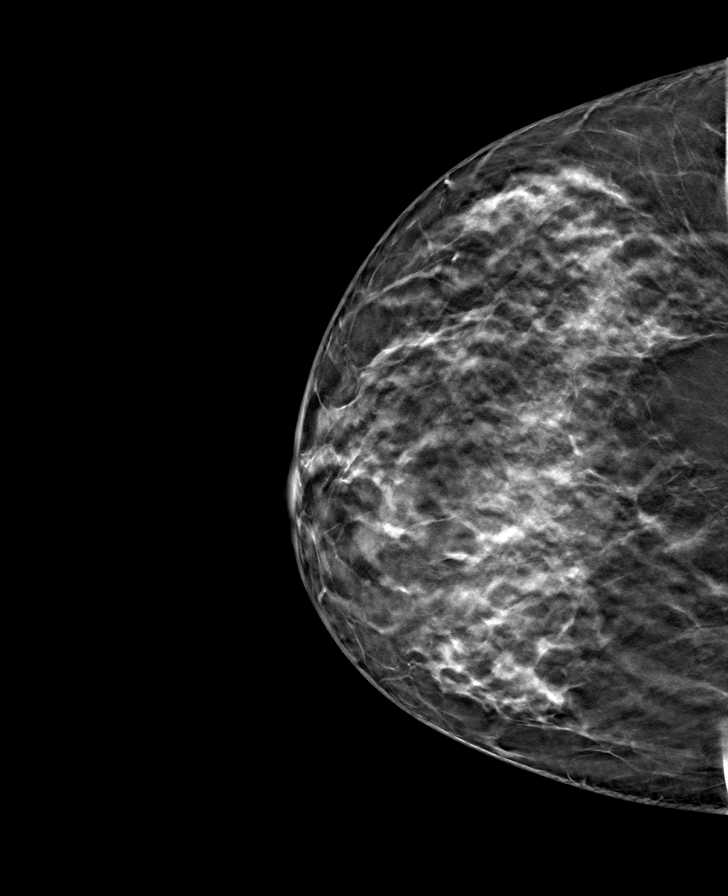

[8 of 24 positions shown; findings below may reference images not displayed]

ACR Breast Density Category c: The breast tissue is heterogeneously
dense, which may obscure small masses.
FINDINGS: There are no findings suspicious for malignancy.
IMPRESSION: No mammographic evidence of malignancy. A result letter of this
screening mammogram will be mailed directly to the patient.

RECOMMENDATION:
Screening mammogram in one year. (Code:Q3-W-BC3)

BI-RADS CATEGORY  1: Negative.

## 2023-03-29 ENCOUNTER — Other Ambulatory Visit: Payer: Self-pay | Admitting: Family Medicine

## 2023-03-29 ENCOUNTER — Other Ambulatory Visit: Payer: Self-pay

## 2023-03-29 MED ORDER — IBUPROFEN 600 MG PO TABS
600.0000 mg | ORAL_TABLET | Freq: Three times a day (TID) | ORAL | 0 refills | Status: DC | PRN
  Filled 2023-03-29 (×2): qty 60, 20d supply, fill #0

## 2023-04-29 ENCOUNTER — Other Ambulatory Visit: Payer: Self-pay

## 2023-05-15 ENCOUNTER — Encounter: Payer: Self-pay | Admitting: Family Medicine

## 2023-06-06 ENCOUNTER — Other Ambulatory Visit: Payer: Self-pay | Admitting: Family Medicine

## 2023-06-06 DIAGNOSIS — Z1231 Encounter for screening mammogram for malignant neoplasm of breast: Secondary | ICD-10-CM

## 2023-06-07 ENCOUNTER — Other Ambulatory Visit (HOSPITAL_COMMUNITY): Payer: Self-pay

## 2023-06-07 ENCOUNTER — Ambulatory Visit: Payer: Commercial Managed Care - PPO | Admitting: Family Medicine

## 2023-06-07 ENCOUNTER — Encounter: Payer: Self-pay | Admitting: Family Medicine

## 2023-06-07 VITALS — BP 106/70 | HR 99 | Temp 97.9°F | Resp 18 | Ht 66.0 in | Wt 200.0 lb

## 2023-06-07 DIAGNOSIS — E785 Hyperlipidemia, unspecified: Secondary | ICD-10-CM | POA: Diagnosis not present

## 2023-06-07 DIAGNOSIS — E1165 Type 2 diabetes mellitus with hyperglycemia: Secondary | ICD-10-CM | POA: Diagnosis not present

## 2023-06-07 DIAGNOSIS — M25512 Pain in left shoulder: Secondary | ICD-10-CM | POA: Diagnosis not present

## 2023-06-07 DIAGNOSIS — M25511 Pain in right shoulder: Secondary | ICD-10-CM | POA: Diagnosis not present

## 2023-06-07 DIAGNOSIS — Z7985 Long-term (current) use of injectable non-insulin antidiabetic drugs: Secondary | ICD-10-CM

## 2023-06-07 LAB — HEMOGLOBIN A1C: Hgb A1c MFr Bld: 5.6 % (ref 4.6–6.5)

## 2023-06-07 LAB — COMPREHENSIVE METABOLIC PANEL
ALT: 8 U/L (ref 0–35)
AST: 5 U/L (ref 0–37)
Albumin: 4.5 g/dL (ref 3.5–5.2)
Alkaline Phosphatase: 48 U/L (ref 39–117)
BUN: 13 mg/dL (ref 6–23)
CO2: 31 meq/L (ref 19–32)
Calcium: 9.6 mg/dL (ref 8.4–10.5)
Chloride: 106 meq/L (ref 96–112)
Creatinine, Ser: 0.91 mg/dL (ref 0.40–1.20)
GFR: 72.04 mL/min (ref >60.00)
Glucose, Bld: 79 mg/dL (ref 70–99)
Potassium: 4.2 meq/L (ref 3.5–5.1)
Sodium: 144 meq/L (ref 135–145)
Total Bilirubin: 0.4 mg/dL (ref 0.2–1.2)
Total Protein: 7.5 g/dL (ref 6.0–8.3)

## 2023-06-07 LAB — MICROALBUMIN / CREATININE URINE RATIO
Creatinine,U: 206.1 mg/dL
Microalb Creat Ratio: 4 mg/g (ref 0.0–30.0)
Microalb, Ur: 0.8 mg/dL (ref 0.0–1.9)

## 2023-06-07 LAB — LIPID PANEL
Cholesterol: 133 mg/dL (ref 0–200)
HDL: 45.5 mg/dL (ref >39.00)
LDL Cholesterol: 77 mg/dL (ref 0–99)
NonHDL: 87.32
Total CHOL/HDL Ratio: 3
Triglycerides: 54 mg/dL (ref 0.0–149.0)
VLDL: 10.8 mg/dL (ref 0.0–40.0)

## 2023-06-07 MED ORDER — METHYLPREDNISOLONE ACETATE 40 MG/ML IJ SUSP
40.0000 mg | Freq: Once | INTRAMUSCULAR | Status: AC
  Administered 2023-06-07: 40 mg via INTRAMUSCULAR

## 2023-06-07 MED ORDER — MELOXICAM 15 MG PO TABS
15.0000 mg | ORAL_TABLET | Freq: Every day | ORAL | 0 refills | Status: DC
  Filled 2023-06-07: qty 30, 30d supply, fill #0

## 2023-06-07 NOTE — Progress Notes (Signed)
 Subjective:   Chief Complaint  Patient presents with   Follow-up    6 month    Ellen Mills is a 53 y.o. female here for follow-up of diabetes.   Ellen Mills's self monitored glucose range is 80-90's.  Patient denies hypoglycemic reactions. She checks her glucose levels intermittently.  Patient does not require insulin.   Medications include: Mounjaro 15 mg/week Diet is healthy.  Exercise: walking  Dyslipidemia Patient presents for dyslipidemia follow up. Currently being treated with Crestor  and compliance with treatment thus far has been good. She denies myalgias. Diet/exercise as above. No CP or SOB.  The patient is not known to have coexisting coronary artery disease.  Chronic b/l shoulder pain, worse on the R. Had injections in both shoulders 6 mo ago and it worked well. Requesting another shot. ROM OK. No bruising, redness, swelling, catching, locking.  Past Medical History:  Diagnosis Date   Anemia    Diabetes ([facility]) 07/2017   Hyperlipidemia    Obesity      Related testing: Retinal exam: Done Pneumovax: done  Objective:  BP 106/70   Pulse 99   Temp 97.9 F (36.6 C)   Resp 18   Ht 5\' 6"  (1.676 m)   Wt 200 lb (90.7 kg)   SpO2 99%   BMI 32.28 kg/m  General:  Well developed, well nourished, in no apparent distress Lungs:  CTAB, no access msc use Cardio:  RRR, no bruits, no LE edema MSK: Normal active and passive range of motion of both shoulders, no deformity, no edema, no ecchymosis, no excessive warmth.  Positive Neer's and Hawkins bilaterally, empty can equivocal, negative liftoff, crossover, speeds, O'Brien's; no TTP. Psych: Age appropriate judgment and insight  Procedure Note; Shoulder bursa injection Verbal consent obtained. The area was palpated, an area was marked just caudal to the acromion process laterally, and cleaned with alcohol x1. A 27-gauge needle was used to enter the joint laterally with ease. 40 mg of Depomedrol with 2 mL of 1%  lidocaine was injected. A bandage was placed.  This process was repeated on the contralateral side. The patient tolerated the procedure well. There were no complications noted.  Assessment:   Type 2 diabetes mellitus with hyperglycemia, without long-term current use of insulin (HCC) - Plan: Comprehensive metabolic panel, Lipid panel, Hemoglobin A1c, Microalbumin / creatinine urine ratio  Dyslipidemia  Bilateral shoulder pain, unspecified chronicity - Plan: methylPREDNISolone acetate (DEPO-MEDROL) injection 40 mg, methylPREDNISolone acetate (DEPO-MEDROL) injection 40 mg, PR ARTHROCENTESIS ASPIR&/INJ MAJOR JT/BURSA W/O [place]   Plan:   Chronic, stable. Cont Mounjaro 15 mg/week. Counseled on diet and exercise. Chronic, stable. Cont Crestor 20 mg/d. Chronic, not stable.  At her request, we will inject again.  If it lasts less than 3 months, we will not inject again.  Continue HEP. She is not thrilled with the idea of seeing a specialist for this. F/u in 6 mo. The patient voiced understanding and agreement to the plan.  Jilda Roche [place], DO 06/07/23 9:55 AM

## 2023-06-08 ENCOUNTER — Encounter: Payer: Self-pay | Admitting: Family Medicine

## 2023-06-28 ENCOUNTER — Ambulatory Visit
Admission: RE | Admit: 2023-06-28 | Discharge: 2023-06-28 | Discharge status: Home / Self Care | Payer: Self-pay | Source: Ambulatory Visit | Attending: Family Medicine

## 2023-06-28 DIAGNOSIS — Z1231 Encounter for screening mammogram for malignant neoplasm of breast: Secondary | ICD-10-CM

## 2023-07-02 ENCOUNTER — Other Ambulatory Visit: Payer: Self-pay | Admitting: Family Medicine

## 2023-07-02 ENCOUNTER — Other Ambulatory Visit (HOSPITAL_COMMUNITY): Payer: Self-pay

## 2023-07-02 DIAGNOSIS — E119 Type 2 diabetes mellitus without complications: Secondary | ICD-10-CM

## 2023-07-02 DIAGNOSIS — E785 Hyperlipidemia, unspecified: Secondary | ICD-10-CM

## 2023-07-02 MED ORDER — ROSUVASTATIN CALCIUM 20 MG PO TABS
20.0000 mg | ORAL_TABLET | Freq: Every day | ORAL | 3 refills | Status: AC
  Filled 2023-07-02: qty 90, 90d supply, fill #0
  Filled 2023-10-01: qty 90, 90d supply, fill #1
  Filled 2024-01-06: qty 90, 90d supply, fill #2
  Filled 2024-04-07: qty 90, 90d supply, fill #3

## 2023-08-01 ENCOUNTER — Other Ambulatory Visit: Payer: Self-pay | Admitting: Family Medicine

## 2023-08-01 ENCOUNTER — Other Ambulatory Visit: Payer: Self-pay

## 2023-08-01 ENCOUNTER — Other Ambulatory Visit (HOSPITAL_COMMUNITY): Payer: Self-pay

## 2023-08-01 MED ORDER — GABAPENTIN 100 MG PO CAPS
200.0000 mg | ORAL_CAPSULE | Freq: Every evening | ORAL | 0 refills | Status: DC
  Filled 2023-08-01: qty 180, 90d supply, fill #0

## 2023-08-01 MED ORDER — MELOXICAM 15 MG PO TABS
15.0000 mg | ORAL_TABLET | Freq: Every day | ORAL | 0 refills | Status: DC
  Filled 2023-08-01: qty 30, 30d supply, fill #0

## 2023-10-01 ENCOUNTER — Other Ambulatory Visit: Payer: Self-pay

## 2023-10-01 ENCOUNTER — Other Ambulatory Visit: Payer: Self-pay | Admitting: Family

## 2023-10-02 ENCOUNTER — Encounter: Payer: Self-pay | Admitting: Family Medicine

## 2023-10-02 ENCOUNTER — Other Ambulatory Visit (HOSPITAL_COMMUNITY): Payer: Self-pay

## 2023-10-03 ENCOUNTER — Other Ambulatory Visit (HOSPITAL_COMMUNITY): Payer: Self-pay

## 2023-10-03 MED ORDER — MELOXICAM 15 MG PO TABS
15.0000 mg | ORAL_TABLET | Freq: Every day | ORAL | 0 refills | Status: DC
  Filled 2023-10-03: qty 30, 30d supply, fill #0

## 2023-10-23 ENCOUNTER — Other Ambulatory Visit (HOSPITAL_BASED_OUTPATIENT_CLINIC_OR_DEPARTMENT_OTHER): Payer: Self-pay

## 2023-10-23 ENCOUNTER — Telehealth: Admitting: Physician Assistant

## 2023-10-23 DIAGNOSIS — R3989 Other symptoms and signs involving the genitourinary system: Secondary | ICD-10-CM

## 2023-10-23 MED ORDER — CEPHALEXIN 500 MG PO CAPS
500.0000 mg | ORAL_CAPSULE | Freq: Two times a day (BID) | ORAL | 0 refills | Status: AC
  Filled 2023-10-23: qty 14, 7d supply, fill #0

## 2023-10-23 NOTE — Progress Notes (Signed)
 I have spent 5 minutes in review of e-visit questionnaire, review and updating patient chart, medical decision making and response to patient.   Piedad Climes, PA-C

## 2023-10-23 NOTE — Progress Notes (Signed)

## 2023-10-25 ENCOUNTER — Encounter: Payer: Self-pay | Admitting: Advanced Practice Midwife

## 2023-10-25 DIAGNOSIS — H52203 Unspecified astigmatism, bilateral: Secondary | ICD-10-CM | POA: Diagnosis not present

## 2023-10-25 DIAGNOSIS — H5203 Hypermetropia, bilateral: Secondary | ICD-10-CM | POA: Diagnosis not present

## 2023-10-25 LAB — HM DIABETES EYE EXAM

## 2023-11-05 ENCOUNTER — Other Ambulatory Visit: Payer: Self-pay | Admitting: Family Medicine

## 2023-11-05 ENCOUNTER — Other Ambulatory Visit (HOSPITAL_BASED_OUTPATIENT_CLINIC_OR_DEPARTMENT_OTHER): Payer: Self-pay

## 2023-11-05 MED ORDER — GABAPENTIN 100 MG PO CAPS
200.0000 mg | ORAL_CAPSULE | Freq: Every evening | ORAL | 0 refills | Status: AC
  Filled 2023-11-05: qty 180, 90d supply, fill #0

## 2023-11-05 MED ORDER — MOUNJARO 15 MG/0.5ML ~~LOC~~ SOAJ
15.0000 mg | SUBCUTANEOUS | 2 refills | Status: AC
  Filled 2023-11-05: qty 6, 84d supply, fill #0
  Filled 2024-01-06: qty 6, 84d supply, fill #1
  Filled 2024-01-14 – 2024-04-17 (×4): qty 6, 84d supply, fill #0
  Filled ????-??-??: fill #1

## 2023-12-10 ENCOUNTER — Other Ambulatory Visit (HOSPITAL_BASED_OUTPATIENT_CLINIC_OR_DEPARTMENT_OTHER): Payer: Self-pay

## 2023-12-10 ENCOUNTER — Encounter: Payer: Self-pay | Admitting: Family Medicine

## 2023-12-10 ENCOUNTER — Ambulatory Visit (INDEPENDENT_AMBULATORY_CARE_PROVIDER_SITE_OTHER): Admitting: Family Medicine

## 2023-12-10 ENCOUNTER — Ambulatory Visit: Payer: Self-pay | Admitting: Family Medicine

## 2023-12-10 ENCOUNTER — Ambulatory Visit (HOSPITAL_BASED_OUTPATIENT_CLINIC_OR_DEPARTMENT_OTHER)
Admission: RE | Admit: 2023-12-10 | Discharge: 2023-12-10 | Disposition: A | Discharge status: Home / Self Care | Source: Ambulatory Visit | Attending: Family Medicine | Admitting: Family Medicine

## 2023-12-10 VITALS — BP 110/74 | HR 100 | Temp 98.0°F | Resp 16 | Ht 66.0 in | Wt 190.0 lb

## 2023-12-10 DIAGNOSIS — E1165 Type 2 diabetes mellitus with hyperglycemia: Secondary | ICD-10-CM | POA: Diagnosis not present

## 2023-12-10 DIAGNOSIS — Z7985 Long-term (current) use of injectable non-insulin antidiabetic drugs: Secondary | ICD-10-CM

## 2023-12-10 DIAGNOSIS — E042 Nontoxic multinodular goiter: Secondary | ICD-10-CM | POA: Diagnosis not present

## 2023-12-10 DIAGNOSIS — E010 Iodine-deficiency related diffuse (endemic) goiter: Secondary | ICD-10-CM | POA: Insufficient documentation

## 2023-12-10 DIAGNOSIS — R221 Localized swelling, mass and lump, neck: Secondary | ICD-10-CM | POA: Diagnosis not present

## 2023-12-10 DIAGNOSIS — Z23 Encounter for immunization: Secondary | ICD-10-CM

## 2023-12-10 DIAGNOSIS — Z0000 Encounter for general adult medical examination without abnormal findings: Secondary | ICD-10-CM | POA: Diagnosis not present

## 2023-12-10 LAB — CBC
HCT: 38.7 % (ref 36.0–46.0)
Hemoglobin: 12.5 g/dL (ref 12.0–15.0)
MCHC: 32.2 g/dL (ref 30.0–36.0)
MCV: 88.1 fl (ref 78.0–100.0)
Platelets: 333 K/uL (ref 150.0–400.0)
RBC: 4.4 Mil/uL (ref 3.87–5.11)
RDW: 13.5 % (ref 11.5–15.5)
WBC: 5.5 K/uL (ref 4.0–10.5)

## 2023-12-10 LAB — COMPREHENSIVE METABOLIC PANEL WITH GFR
ALT: 8 U/L (ref 0–35)
AST: 5 U/L (ref 0–37)
Albumin: 4.3 g/dL (ref 3.5–5.2)
Alkaline Phosphatase: 50 U/L (ref 39–117)
BUN: 12 mg/dL (ref 6–23)
CO2: 28 meq/L (ref 19–32)
Calcium: 9.3 mg/dL (ref 8.4–10.5)
Chloride: 105 meq/L (ref 96–112)
Creatinine, Ser: 0.8 mg/dL (ref 0.40–1.20)
GFR: 83.78 mL/min (ref >60.00)
Glucose, Bld: 78 mg/dL (ref 70–99)
Potassium: 4.5 meq/L (ref 3.5–5.1)
Sodium: 141 meq/L (ref 135–145)
Total Bilirubin: 0.3 mg/dL (ref 0.2–1.2)
Total Protein: 7.3 g/dL (ref 6.0–8.3)

## 2023-12-10 LAB — LIPID PANEL
Cholesterol: 130 mg/dL (ref 0–200)
HDL: 43 mg/dL (ref >39.00)
LDL Cholesterol: 64 mg/dL (ref 0–99)
NonHDL: 87.24
Total CHOL/HDL Ratio: 3
Triglycerides: 117 mg/dL (ref 0.0–149.0)
VLDL: 23.4 mg/dL (ref 0.0–40.0)

## 2023-12-10 LAB — TSH: TSH: 0.5 u[IU]/mL (ref 0.35–5.50)

## 2023-12-10 LAB — HEMOGLOBIN A1C: Hgb A1c MFr Bld: 5.9 % (ref 4.6–6.5)

## 2023-12-10 MED ORDER — MELOXICAM 15 MG PO TABS
15.0000 mg | ORAL_TABLET | Freq: Every day | ORAL | 2 refills | Status: AC
  Filled 2023-12-10: qty 30, 30d supply, fill #0
  Filled 2024-01-06: qty 30, 30d supply, fill #1
  Filled 2024-02-05: qty 30, 30d supply, fill #2

## 2023-12-10 NOTE — Addendum Note (Signed)
 Addended by: Jamesha Ellsworth M on: 12/10/2023 10:31 AM   Modules accepted: Orders

## 2023-12-10 NOTE — Patient Instructions (Signed)
 Give [place] 2-3 business days to get the results of your labs back.   Keep the diet clean and stay active.  Please get me a copy of your advanced directive form at your convenience.   Let us know if you need anything.

## 2023-12-10 NOTE — Progress Notes (Signed)
 Chief Complaint  Patient presents with   Annual Exam    CPE     Well Woman Ellen Mills is here for a complete physical.   Her last physical was >1 year ago.  Current diet: in general, a healthy diet. Current exercise: active at work. Weight is intentionally losing and she denies fatigue out of ordinary. No LMP recorded. Patient has had a hysterectomy. Seatbelt? Yes Advanced directive? No  Health Maintenance Pap/HPV- N/A1 Mammogram- Yes Colon cancer screening-Yes Shingrix- Yes Tetanus- Yes Hep C screening- Yes HIV screening- Yes  Past Medical History:  Diagnosis Date   Anemia    Diabetes ([facility]) 07/2017   Hyperlipidemia    Obesity      Past Surgical History:  Procedure Laterality Date   ABDOMINAL HYSTERECTOMY     LEEP      Medications  Current Outpatient Medications on File Prior to Visit  Medication Sig Dispense Refill   aspirin EC 81 MG tablet Take 81 mg by mouth daily.     Blood Glucose Monitoring Suppl (FREESTYLE LITE) w/Device KIT Use as directed 1 kit 0   gabapentin  (NEURONTIN ) 100 MG capsule Take 2 capsules (200 mg total) by mouth at bedtime. 180 capsule 0   glucose blood (FREESTYLE LITE) test strip Use once daily as directed as needed 100 each 3   Lancets (FREESTYLE) lancets Use daily as instructed 100 each 3   rosuvastatin  (CRESTOR ) 20 MG tablet Take 1 tablet (20 mg total) by mouth daily. 90 tablet 3   tirzepatide  (MOUNJARO ) 15 MG/0.5ML Pen Inject 15 mg into the skin once a week. 6 mL 2    Allergies No Known Allergies  Review of Systems: Constitutional:  no unexpected weight changes Eye:  no recent significant change in vision Ear/Nose/Mouth/Throat:  Ears:  no recent change in hearing Nose/Mouth/Throat:  no complaints of nasal congestion, no sore throat Cardiovascular: no chest pain Respiratory:  no shortness of breath Gastrointestinal:  no abdominal pain, no change in bowel habits GU:  Female: negative for dysuria or pelvic  pain Musculoskeletal/Extremities:  no pain of the joints Integumentary (Skin/Breast):  no abnormal skin lesions reported Neurologic:  no headaches Endocrine:  denies fatigue  Exam BP 110/74 (BP Location: Left Arm, Patient Position: Sitting)   Pulse 100   Temp 98 F (36.7 C) (Oral)   Resp 16   Ht 5' 6 (1.676 m)   Wt 190 lb (86.2 kg)   SpO2 95%   BMI 30.67 kg/m  General:  well developed, well nourished, in no apparent distress Skin:  no significant moles, warts, or growths Head:  no masses, lesions, or tenderness Eyes:  pupils equal and round, sclera anicteric without injection Ears:  canals without lesions, TMs shiny without retraction, no obvious effusion, no erythema Nose:  nares patent, mucosa normal, and no drainage  Throat/Pharynx:  lips and gingiva without lesion; tongue and uvula midline; non-inflamed pharynx; no exudates or postnasal drainage Neck: neck supple without adenopathy, +thyromegaly Lungs:  clear to auscultation, breath sounds equal bilaterally, no respiratory distress Cardio:  regular rate and rhythm, no LE edema Abdomen:  abdomen soft, nontender; bowel sounds normal; no masses or organomegaly Genital: Defer to GYN Musculoskeletal:  symmetrical muscle groups noted without atrophy or deformity Extremities:  no clubbing, cyanosis, or edema, no deformities, no skin discoloration Neuro:  gait normal; deep tendon reflexes normal and symmetric Psych: well oriented with normal range of affect and appropriate judgment/insight  Assessment and Plan  Well adult exam - Plan:  CBC, Comprehensive metabolic panel with GFR, Lipid panel, Hepatitis B surface antibody,quantitative  Type 2 diabetes mellitus with hyperglycemia, without long-term current use of insulin  (HCC) - Plan: Hemoglobin A1c  Thyromegaly - Plan: US  THYROID , TSH   Well 53 y.o. female. Counseled on diet and exercise. Advanced directive form provided today.  Thyroid  workup as above. Asymptomatic.  Flu  shot today.  Screen Hep B.  Other orders as above. Follow up in 6 mo. The patient voiced understanding and agreement to the plan.  Ellen [place], DO 12/10/23 10:17 AM

## 2023-12-11 LAB — HEPATITIS B SURFACE ANTIBODY, QUANTITATIVE: Hep B S AB Quant (Post): 27 m[IU]/mL (ref >=10)

## 2023-12-17 ENCOUNTER — Encounter: Payer: Self-pay | Admitting: Family Medicine

## 2023-12-20 ENCOUNTER — Encounter: Payer: Commercial Managed Care - PPO | Admitting: Family Medicine

## 2023-12-30 ENCOUNTER — Ambulatory Visit (INDEPENDENT_AMBULATORY_CARE_PROVIDER_SITE_OTHER): Admitting: Otolaryngology

## 2023-12-30 ENCOUNTER — Encounter (INDEPENDENT_AMBULATORY_CARE_PROVIDER_SITE_OTHER): Payer: Self-pay | Admitting: Otolaryngology

## 2023-12-30 VITALS — BP 113/74 | HR 77 | Ht 66.0 in | Wt 190.0 lb

## 2023-12-30 DIAGNOSIS — R1319 Other dysphagia: Secondary | ICD-10-CM | POA: Diagnosis not present

## 2023-12-30 DIAGNOSIS — E042 Nontoxic multinodular goiter: Secondary | ICD-10-CM | POA: Diagnosis not present

## 2023-12-30 NOTE — Progress Notes (Signed)
 Dear Dr. Frann, Here is my assessment for our mutual patient, Ellen Mills. Thank you for allowing me the opportunity to care for your patient. Please do not hesitate to contact me should you have any other questions. Sincerely, Dr. Eldora Blanch  Otolaryngology Clinic Note Referring provider: Dr. Frann HPI:  Ellen Mills is a 53 y.o. female kindly referred by Dr. Frann for evaluation of thyroid  nodules.   Initial visit (12/2023): Reports she has always felt a lump in her neck. Dr. Frann noticed a thyroid  nodule and performed a workup. Maybe enlarging some. Patient reports: some compressive symptoms - does have some issues swallowing pills. No PNA. She reports that she started magnesium and this has helped with her swallowing. Never had a swallow test Compressive symptoms: otherwise denies, no SOB, no voice changes, no trouble with breathing Hypo or hyperthyroid symptoms: no Tobacco: no  Prior evaluation has included: labs, US   History of radiation to H&N: no Family history of thyroid  cancer: no   ENT Surgery: no Personal or FHx of bleeding dz or anesthesia difficulty: no  GLP-1: yes AP/AC: ASA 81 (patient wishes to be on it)  Tobacco: no  PMHx: T2DM, HLD  Independent Review of Additional Tests or Records:  TSH 12/10/2023: 0.5 US  Thyroid  12/10/2023 independently interpreted, agree with read: multinodular goiter, b/l   PMH/Meds/All/SocHx/FamHx/ROS:   Past Medical History:  Diagnosis Date   Anemia    Diabetes (HCC) 07/2017   Hyperlipidemia    Obesity      Past Surgical History:  Procedure Laterality Date   ABDOMINAL HYSTERECTOMY     LEEP      Family History  Problem Relation Age of Onset   Cancer Mother        lung cancer   Hyperlipidemia Mother    Depression Mother    Anxiety disorder Mother    Depression Father    Hypertension Father    Colon cancer Neg Hx    Esophageal cancer Neg Hx      Social Connections: Not on file      Current  Outpatient Medications:    aspirin EC 81 MG tablet, Take 81 mg by mouth daily., Disp: , Rfl:    Blood Glucose Monitoring Suppl (FREESTYLE LITE) w/Device KIT, Use as directed, Disp: 1 kit, Rfl: 0   gabapentin  (NEURONTIN ) 100 MG capsule, Take 2 capsules (200 mg total) by mouth at bedtime., Disp: 180 capsule, Rfl: 0   glucose blood (FREESTYLE LITE) test strip, Use once daily as directed as needed, Disp: 100 each, Rfl: 3   Lancets (FREESTYLE) lancets, Use daily as instructed, Disp: 100 each, Rfl: 3   magnesium 30 MG tablet, Take 30 mg by mouth 2 (two) times daily., Disp: , Rfl:    meloxicam  (MOBIC ) 15 MG tablet, Take 1 tablet (15 mg total) by mouth daily., Disp: 30 tablet, Rfl: 2   rosuvastatin  (CRESTOR ) 20 MG tablet, Take 1 tablet (20 mg total) by mouth daily., Disp: 90 tablet, Rfl: 3   tirzepatide  (MOUNJARO ) 15 MG/0.5ML Pen, Inject 15 mg into the skin once a week., Disp: 6 mL, Rfl: 2   Physical Exam:   BP 113/74 (BP Location: Right Arm, Patient Position: Sitting, Cuff Size: Large)   Pulse 77   Ht 5' 6 (1.676 m)   Wt 190 lb (86.2 kg)   SpO2 93%   BMI 30.67 kg/m   Salient findings:  CN II-XII intact Bilateral EAC clear and TM intact with well pneumatized middle ear spaces Anterior rhinoscopy: Septum  intact; bilateral inferior turbinates without significant hypertrophy No lesions of oral cavity/oropharynx No obviously palpable neck masses/lymphadenopathy - diffuse suprasternal thyromegaly, can palpate isthmus nodule No respiratory distress or stridor. TFL was indicated to better evaluate the proximal airway, given the patient's history and exam findings, and is detailed below. Seprately Identifiable Procedures:  Prior to initiating any procedures, risks/benefits/alternatives were explained to the patient and verbal consent obtained. Procedure Note Pre-procedure diagnosis:  Dysphagia, thyroid  nodules Post-procedure diagnosis: Same Procedure: Transnasal Fiberoptic Laryngoscopy, CPT 31575 -  Mod 25 Indication: see above Complications: None apparent EBL: 0 mL  The procedure was undertaken to further evaluate the patient's complaint above, with mirror exam inadequate for appropriate examination due to gag reflex and poor patient tolerance  Procedure:  Patient was identified as correct patient. Verbal consent was obtained. The nose was sprayed with oxymetazoline and 4% lidocaine. The The flexible laryngoscope was passed through the nose to view the nasal cavity, pharynx (oropharynx, hypopharynx) and larynx.  The larynx was examined at rest and during multiple phonatory tasks. Documentation was obtained and reviewed with patient. The scope was removed. The patient tolerated the procedure well.  Findings: The nasal cavity and nasopharynx did not reveal any masses or lesions, mucosa appeared to be without obvious lesions. The tongue base, pharyngeal walls, piriform sinuses, vallecula, epiglottis and postcricoid region are normal in appearance. The visualized portion of the subglottis and proximal trachea is widely patent. The vocal folds are mobile bilaterally. There are no lesions on the free edge of the vocal folds nor elsewhere in the larynx worrisome for malignancy.    Electronically signed by: Eldora KATHEE Blanch, MD 12/30/2023 12:30 PM   Impression & Plans:  Ellen Mills is a 53 y.o. female with:  1. Multiple thyroid  nodules   2. Other dysphagia    Noted multinodular goiter but all nodules relatively anterior; has had US  but no bx, which I'd recommend for left and right thyroid  nodules. We discussed pathophys for thyroid  nodules, and management. She does have some pill dysphagia but would not expect nodules to cause this; we discussed MBS, but she does not wish for this  - FNA thyroid  nodules  - Free T4 - f/u in 4 weeks with FNA  See below regarding exact medications prescribed this encounter including dosages and route: No orders of the defined types were placed in this  encounter.     Thank you for allowing me the opportunity to care for your patient. Please do not hesitate to contact me should you have any other questions.  Sincerely, Eldora Blanch, MD Otolaryngologist (ENT), [facility] Phone: [phone] Fax: [phone]  12/30/2023, 12:30 PM   I have personally spent 46 minutes involved in face-to-face and non-face-to-face activities for this patient on the day of the visit.  Professional time spent excludes any procedures performed but includes the following activities, in addition to those noted in the documentation: preparing to see the patient (review of outside documentation and results), performing a medically appropriate examination, counseling, documenting in the electronic health record, independently interpreting results (thyroid  US ).

## 2023-12-30 NOTE — Patient Instructions (Signed)
 I have ordered an imaging study for you to complete prior to your next visit. Please call [facility] Scheduling at [phone] to schedule your imaging if you have not received a call within 24 hours. If you are unable to complete your imaging study prior to your next scheduled visit please call our office to let us  know.

## 2023-12-31 LAB — T4, FREE: Free T4: 1.18 ng/dL (ref 0.82–1.77)

## 2024-01-06 ENCOUNTER — Ambulatory Visit
Admission: RE | Admit: 2024-01-06 | Discharge: 2024-01-06 | Disposition: A | Source: Ambulatory Visit | Attending: Otolaryngology

## 2024-01-06 ENCOUNTER — Ambulatory Visit
Admission: RE | Admit: 2024-01-06 | Discharge: 2024-01-06 | Disposition: A | Discharge status: Home / Self Care | Source: Ambulatory Visit | Attending: Otolaryngology | Admitting: Otolaryngology

## 2024-01-06 ENCOUNTER — Other Ambulatory Visit (HOSPITAL_COMMUNITY)
Admission: RE | Admit: 2024-01-06 | Discharge: 2024-01-06 | Disposition: A | Discharge status: Home / Self Care | Source: Ambulatory Visit | Attending: Otolaryngology | Admitting: Otolaryngology

## 2024-01-06 DIAGNOSIS — E042 Nontoxic multinodular goiter: Secondary | ICD-10-CM

## 2024-01-06 DIAGNOSIS — E041 Nontoxic single thyroid nodule: Secondary | ICD-10-CM | POA: Diagnosis not present

## 2024-01-07 ENCOUNTER — Other Ambulatory Visit: Payer: Self-pay

## 2024-01-08 LAB — CYTOLOGY - NON PAP

## 2024-01-10 ENCOUNTER — Other Ambulatory Visit (HOSPITAL_COMMUNITY): Payer: Self-pay

## 2024-02-03 ENCOUNTER — Institutional Professional Consult (permissible substitution) (INDEPENDENT_AMBULATORY_CARE_PROVIDER_SITE_OTHER): Admitting: Otolaryngology

## 2024-02-06 ENCOUNTER — Encounter (INDEPENDENT_AMBULATORY_CARE_PROVIDER_SITE_OTHER): Payer: Self-pay | Admitting: Otolaryngology

## 2024-02-06 ENCOUNTER — Ambulatory Visit (INDEPENDENT_AMBULATORY_CARE_PROVIDER_SITE_OTHER): Admitting: Otolaryngology

## 2024-02-06 ENCOUNTER — Other Ambulatory Visit: Payer: Self-pay

## 2024-02-06 VITALS — BP 117/81 | HR 108 | Ht 66.0 in | Wt 190.0 lb

## 2024-02-06 DIAGNOSIS — E042 Nontoxic multinodular goiter: Secondary | ICD-10-CM | POA: Diagnosis not present

## 2024-02-06 DIAGNOSIS — R1319 Other dysphagia: Secondary | ICD-10-CM | POA: Diagnosis not present

## 2024-02-06 NOTE — Progress Notes (Signed)
 Dear Dr. Frann, Here is my assessment for our mutual patient, Ellen Mills. Thank you for allowing me the opportunity to care for your patient. Please do not hesitate to contact me should you have any other questions. Sincerely, Dr. Eldora Blanch  Otolaryngology Clinic Note Referring provider: Dr. Frann HPI:  Ellen Mills is a 53 y.o. female kindly referred by Dr. Frann for evaluation of thyroid  nodules.   Initial visit (12/2023): Reports she has always felt a lump in her neck. Dr. Frann noticed a thyroid  nodule and performed a workup. Maybe enlarging some. Patient reports: some compressive symptoms - does have some issues swallowing pills. No PNA. She reports that she started magnesium and this has helped with her swallowing some. Never had a swallow test Compressive symptoms: otherwise denies, no SOB, no voice changes, no trouble with breathing Hypo or hyperthyroid symptoms: no Tobacco: no  --------------------------------------------------------- 02/06/2024 Returns for follow up. We did discuss her FNA and her options.   Prior evaluation has included: labs, US , FNA  History of radiation to H&N: no Family history of thyroid  cancer: no   ENT Surgery: no Personal or FHx of bleeding dz or anesthesia difficulty: no  GLP-1: yes AP/AC: ASA 81 (patient wishes to be on it)  Tobacco: no  PMHx: T2DM, HLD  Independent Review of Additional Tests or Records:  TSH 12/10/2023: 0.5 US  Thyroid  12/10/2023 independently interpreted, agree with read: multinodular goiter, b/l  FNA 01/06/2024: right mid and left mid nodules - Bethesda II PMH/Meds/All/SocHx/FamHx/ROS:   Past Medical History:  Diagnosis Date   Anemia    Diabetes (HCC) 07/2017   Hyperlipidemia    Obesity      Past Surgical History:  Procedure Laterality Date   ABDOMINAL HYSTERECTOMY     LEEP      Family History  Problem Relation Age of Onset   Cancer Mother        lung cancer   Hyperlipidemia Mother     Depression Mother    Anxiety disorder Mother    Depression Father    Hypertension Father    Colon cancer Neg Hx    Esophageal cancer Neg Hx      Social Connections: Not on file      Current Outpatient Medications:    aspirin EC 81 MG tablet, Take 81 mg by mouth daily., Disp: , Rfl:    Blood Glucose Monitoring Suppl (FREESTYLE LITE) w/Device KIT, Use as directed, Disp: 1 kit, Rfl: 0   gabapentin  (NEURONTIN ) 100 MG capsule, Take 2 capsules (200 mg total) by mouth at bedtime., Disp: 180 capsule, Rfl: 0   glucose blood (FREESTYLE LITE) test strip, Use once daily as directed as needed, Disp: 100 each, Rfl: 3   Lancets (FREESTYLE) lancets, Use daily as instructed, Disp: 100 each, Rfl: 3   magnesium 30 MG tablet, Take 30 mg by mouth 2 (two) times daily., Disp: , Rfl:    meloxicam  (MOBIC ) 15 MG tablet, Take 1 tablet (15 mg total) by mouth daily., Disp: 30 tablet, Rfl: 2   rosuvastatin  (CRESTOR ) 20 MG tablet, Take 1 tablet (20 mg total) by mouth daily., Disp: 90 tablet, Rfl: 3   tirzepatide  (MOUNJARO ) 15 MG/0.5ML Pen, Inject 15 mg into the skin once a week., Disp: 6 mL, Rfl: 2   Physical Exam:   BP 117/81 (BP Location: Left Arm, Patient Position: Sitting, Cuff Size: Large)   Pulse (!) 108   Ht 5' 6 (1.676 m)   Wt 190 lb (86.2 kg)   SpO2  97%   BMI 30.67 kg/m   Salient findings:  CN II-XII intact Bilateral EAC clear and TM intact with well pneumatized middle ear spaces Anterior rhinoscopy: Septum intact; bilateral inferior turbinates without significant hypertrophy No lesions of oral cavity/oropharynx No obviously palpable neck masses/lymphadenopathy - diffuse suprasternal thyromegaly, can palpate isthmus nodule No respiratory distress or stridor. Easily lays flat Seprately Identifiable Procedures:  Prior to initiating any procedures, risks/benefits/alternatives were explained to the patient and verbal consent obtained. Procedure Note (prior, not today) Pre-procedure diagnosis:   Dysphagia, thyroid  nodules Post-procedure diagnosis: Same Procedure: Transnasal Fiberoptic Laryngoscopy, CPT 31575 - Mod 25 Indication: see above Complications: None apparent EBL: 0 mL  The procedure was undertaken to further evaluate the patient's complaint above, with mirror exam inadequate for appropriate examination due to gag reflex and poor patient tolerance  Procedure:  Patient was identified as correct patient. Verbal consent was obtained. The nose was sprayed with oxymetazoline and 4% lidocaine. The The flexible laryngoscope was passed through the nose to view the nasal cavity, pharynx (oropharynx, hypopharynx) and larynx.  The larynx was examined at rest and during multiple phonatory tasks. Documentation was obtained and reviewed with patient. The scope was removed. The patient tolerated the procedure well.  Findings: The nasal cavity and nasopharynx did not reveal any masses or lesions, mucosa appeared to be without obvious lesions. The tongue base, pharyngeal walls, piriform sinuses, vallecula, epiglottis and postcricoid region are normal in appearance. The visualized portion of the subglottis and proximal trachea is widely patent. The vocal folds are mobile bilaterally. There are no lesions on the free edge of the vocal folds nor elsewhere in the larynx worrisome for malignancy.    Electronically signed by: Eldora KATHEE Blanch, MD 02/16/2024 2:22 PM   Impression & Plans:  Ellen Mills is a 53 y.o. female with:  1. Multiple thyroid  nodules   2. Other dysphagia    Noted multinodular goiter but all nodules relatively anterior; Bx of left and right nodules shows Bethesda II. We discussed pathophys for thyroid  nodules, and management. She does have some pill dysphagia and compressive symptoms; we discussed MBS but she declined.  We discussed options in light of her compressive sx -- observation, swallowing workup, thyroidectomy. I reported that it is not guaranteed that a thyroidectomy  would help here and she understands this, especially in light of size of nodules. Despite all these options, she would like to proceed with TOTAL thyroidectomy as she feels like the nodules are growing and would like to have them removed. Risks of thyroidectomy were discussed including pain, bleeding, infection, need for drain placement, injury to recurrent laryngeal nerve including vocal fold paralysis and hoarseness, lack of improvement, need for further procedures, change in swallowing, hypocalcemia, injury to trachea or surrounding great vessels including pulmonary complications, and discovery of malignancy. Patient understands these risks, and despite these risks, wishes to proceed.  I also reported that we could start with just a left thyroid  lobectomy if her main complaint is compressive symptoms but she again reiterates that she'd like to have a total thyroidectomy despite risk of hypocalcemia and RLN injury risk and lack of improvement in symptoms.  Given her sx, we will start on left side first. If nerve stops working intraoperatively, I would stop the procedure to assess the nerve. She understands this.  After extensive discussion as above, will proceed with total thyroidectomy per patient preference and due to compressive sx. Will need drain and overnight observation  F/u POD 5; 1 week preop  phone visit.  See below regarding exact medications prescribed this encounter including dosages and route: No orders of the defined types were placed in this encounter.     Thank you for allowing me the opportunity to care for your patient. Please do not hesitate to contact me should you have any other questions.  Sincerely, Eldora Blanch, MD Otolaryngologist (ENT), [facility] Phone: [phone] Fax: [phone]  02/16/2024, 2:22 PM   I have personally spent 40 minutes involved in face-to-face and non-face-to-face activities for this patient on the day of the visit.   Professional time spent excludes any procedures performed but includes the following activities, in addition to those noted in the documentation: preparing to see the patient (review of outside documentation and results), performing a medically appropriate examination, extensive counseling, documenting in the electronic health record, interpreting results

## 2024-02-13 DIAGNOSIS — Z719 Counseling, unspecified: Secondary | ICD-10-CM

## 2024-03-23 ENCOUNTER — Other Ambulatory Visit (HOSPITAL_COMMUNITY): Payer: Self-pay

## 2024-03-23 ENCOUNTER — Telehealth: Admitting: Physician Assistant

## 2024-03-23 ENCOUNTER — Encounter

## 2024-03-23 ENCOUNTER — Other Ambulatory Visit (HOSPITAL_BASED_OUTPATIENT_CLINIC_OR_DEPARTMENT_OTHER): Payer: Self-pay

## 2024-03-23 DIAGNOSIS — R3989 Other symptoms and signs involving the genitourinary system: Secondary | ICD-10-CM

## 2024-03-23 MED ORDER — CEPHALEXIN 500 MG PO CAPS
500.0000 mg | ORAL_CAPSULE | Freq: Two times a day (BID) | ORAL | 0 refills | Status: DC
  Filled 2024-03-23 (×2): qty 14, 7d supply, fill #0

## 2024-03-23 NOTE — Progress Notes (Signed)

## 2024-03-24 ENCOUNTER — Other Ambulatory Visit (HOSPITAL_BASED_OUTPATIENT_CLINIC_OR_DEPARTMENT_OTHER): Payer: Self-pay

## 2024-04-10 ENCOUNTER — Other Ambulatory Visit (HOSPITAL_COMMUNITY): Payer: Self-pay

## 2024-04-14 ENCOUNTER — Other Ambulatory Visit (HOSPITAL_COMMUNITY): Payer: Self-pay

## 2024-04-15 ENCOUNTER — Other Ambulatory Visit (HOSPITAL_COMMUNITY): Payer: Self-pay

## 2024-04-17 ENCOUNTER — Other Ambulatory Visit (HOSPITAL_COMMUNITY): Payer: Self-pay

## 2024-04-20 ENCOUNTER — Other Ambulatory Visit: Payer: Self-pay

## 2024-04-30 ENCOUNTER — Ambulatory Visit (INDEPENDENT_AMBULATORY_CARE_PROVIDER_SITE_OTHER): Admitting: Otolaryngology

## 2024-04-30 ENCOUNTER — Encounter (INDEPENDENT_AMBULATORY_CARE_PROVIDER_SITE_OTHER): Payer: Self-pay | Admitting: Otolaryngology

## 2024-04-30 DIAGNOSIS — E042 Nontoxic multinodular goiter: Secondary | ICD-10-CM

## 2024-04-30 NOTE — Progress Notes (Addendum)
 Patient identified via two identifiers and teleconsent verbally obtained.   Discussed with patient re: risks and surgery expectations including lack of benefit from swallowing standpoint. Discussed need to stop ASA 81 and Mounjaro  1 week prior to surgery  Risks of thyroidectomy were discussed including pain, bleeding, infection, need for drain placement, injury to recurrent laryngeal nerve including vocal fold paralysis and hoarseness, lack of improvement, need for further procedures, change in swallowing, hypocalcemia, injury to trachea or surrounding great vessels including pulmonary complications, and discovery of malignancy.   Patient understands these risks, and despite these risks, wishes to proceed. Despite risks, she wishes to proceed with total thyroidectomy   Ellen Mills

## 2024-05-13 ENCOUNTER — Encounter (HOSPITAL_COMMUNITY): Payer: Self-pay

## 2024-05-13 ENCOUNTER — Other Ambulatory Visit: Payer: Self-pay

## 2024-05-13 ENCOUNTER — Inpatient Hospital Stay (HOSPITAL_COMMUNITY): Admission: RE | Admit: 2024-05-13 | Discharge: 2024-05-13 | Attending: Otolaryngology

## 2024-05-13 VITALS — BP 98/67 | HR 75 | Temp 98.8°F | Resp 16 | Ht 66.0 in | Wt 187.0 lb

## 2024-05-13 DIAGNOSIS — E1165 Type 2 diabetes mellitus with hyperglycemia: Secondary | ICD-10-CM | POA: Insufficient documentation

## 2024-05-13 DIAGNOSIS — Z01818 Encounter for other preprocedural examination: Secondary | ICD-10-CM | POA: Insufficient documentation

## 2024-05-13 HISTORY — DX: Nontoxic multinodular goiter: E04.2

## 2024-05-13 LAB — BASIC METABOLIC PANEL WITH GFR
Anion gap: 6 (ref 5–15)
BUN: 12 mg/dL (ref 6–20)
CO2: 29 mmol/L (ref 22–32)
Calcium: 9.3 mg/dL (ref 8.9–10.3)
Chloride: 104 mmol/L (ref 98–111)
Creatinine, Ser: 0.76 mg/dL (ref 0.44–1.00)
GFR, Estimated: >60 mL/min
Glucose, Bld: 90 mg/dL (ref 70–99)
Potassium: 4.2 mmol/L (ref 3.5–5.1)
Sodium: 139 mmol/L (ref 135–145)

## 2024-05-13 LAB — CBC
HCT: 38.3 % (ref 36.0–46.0)
Hemoglobin: 12.5 g/dL (ref 12.0–15.0)
MCH: 29.3 pg (ref 26.0–34.0)
MCHC: 32.6 g/dL (ref 30.0–36.0)
MCV: 89.7 fL (ref 80.0–100.0)
Platelets: 297 10*3/uL (ref 150–400)
RBC: 4.27 MIL/uL (ref 3.87–5.11)
RDW: 12.8 % (ref 11.5–15.5)
WBC: 6.1 10*3/uL (ref 4.0–10.5)
nRBC: 0 % (ref 0.0–0.2)

## 2024-05-13 LAB — HEMOGLOBIN A1C
Hgb A1c MFr Bld: 5.3 % (ref 4.8–5.6)
Mean Plasma Glucose: 105.41 mg/dL

## 2024-05-13 LAB — GLUCOSE, CAPILLARY: Glucose-Capillary: 87 mg/dL (ref 70–99)

## 2024-05-13 NOTE — Progress Notes (Signed)
 PCP - Dr. Mabel Pry Cardiologist - denies  PPM/ICD - denies   Chest x-ray - denies EKG - 05/13/24 Stress Test - denies ECHO - denies Cardiac Cath - denies  Sleep Study - denies   Fasting Blood Sugar - 80-100 Checks Blood Sugar every other day  Last dose of GLP1 agonist-  05/07/24 GLP1 instructions: hold 7 days prior  Blood Thinner Instructions: n/a Aspirin Instructions: pt took last dose 2/1  ERAS Protcol - clears until 0900   COVID TEST- n/a   Anesthesia review: no  Patient denies shortness of breath, fever, cough and chest pain at PAT appointment   All instructions explained to the patient, with a verbal understanding of the material. Patient agrees to go over the instructions while at home for a better understanding. The opportunity to ask questions was provided.

## 2024-05-15 ENCOUNTER — Encounter (HOSPITAL_COMMUNITY): Payer: Self-pay | Admitting: Otolaryngology

## 2024-05-15 ENCOUNTER — Ambulatory Visit (HOSPITAL_COMMUNITY)

## 2024-05-15 ENCOUNTER — Ambulatory Visit (HOSPITAL_COMMUNITY): Admission: RE | Admit: 2024-05-15 | Admitting: Otolaryngology

## 2024-05-15 DIAGNOSIS — R1319 Other dysphagia: Secondary | ICD-10-CM

## 2024-05-15 DIAGNOSIS — Z9889 Other specified postprocedural states: Principal | ICD-10-CM

## 2024-05-15 DIAGNOSIS — E042 Nontoxic multinodular goiter: Secondary | ICD-10-CM

## 2024-05-15 DIAGNOSIS — E1165 Type 2 diabetes mellitus with hyperglycemia: Secondary | ICD-10-CM

## 2024-05-15 LAB — GLUCOSE, CAPILLARY
Glucose-Capillary: 74 mg/dL (ref 70–99)
Glucose-Capillary: 75 mg/dL (ref 70–99)
Glucose-Capillary: 101 mg/dL — ABNORMAL HIGH (ref 70–99)
Glucose-Capillary: 107 mg/dL — ABNORMAL HIGH (ref 70–99)
Glucose-Capillary: 128 mg/dL — ABNORMAL HIGH (ref 70–99)

## 2024-05-15 LAB — COMPREHENSIVE METABOLIC PANEL WITH GFR
ALT: 9 U/L (ref 0–44)
AST: 12 U/L — ABNORMAL LOW (ref 15–41)
Albumin: 4.3 g/dL (ref 3.5–5.0)
Alkaline Phosphatase: 49 U/L (ref 38–126)
Anion gap: 10 (ref 5–15)
BUN: 10 mg/dL (ref 6–20)
CO2: 24 mmol/L (ref 22–32)
Calcium: 8.7 mg/dL — ABNORMAL LOW (ref 8.9–10.3)
Chloride: 107 mmol/L (ref 98–111)
Creatinine, Ser: 0.91 mg/dL (ref 0.44–1.00)
GFR, Estimated: >60 mL/min
Glucose, Bld: 170 mg/dL — ABNORMAL HIGH (ref 70–99)
Potassium: 4.2 mmol/L (ref 3.5–5.1)
Sodium: 141 mmol/L (ref 135–145)
Total Bilirubin: 0.3 mg/dL (ref 0.0–1.2)
Total Protein: 7.1 g/dL (ref 6.5–8.1)

## 2024-05-15 MED ORDER — ALBUMIN HUMAN 5 % IV SOLN: INTRAVENOUS | Status: DC | PRN

## 2024-05-15 MED ORDER — IBUPROFEN 100 MG/5ML PO SUSP
400.0000 mg | Freq: Four times a day (QID) | ORAL | Status: AC
  Administered 2024-05-15: 400 mg via ORAL
  Filled 2024-05-15 (×2): qty 20

## 2024-05-15 MED ORDER — MIDAZOLAM HCL (PF) 2 MG/2ML IJ SOLN
INTRAMUSCULAR | Status: DC | PRN
  Administered 2024-05-15: 2 mg via INTRAVENOUS

## 2024-05-15 MED ORDER — OXYCODONE HCL 5 MG/5ML PO SOLN
5.0000 mg | ORAL | Status: AC | PRN
  Administered 2024-05-15: 5 mg via ORAL
  Filled 2024-05-15: qty 5

## 2024-05-15 MED ORDER — CEFAZOLIN SODIUM-DEXTROSE 2-3 GM-%(50ML) IV SOLR
INTRAVENOUS | Status: DC | PRN
  Administered 2024-05-15 (×2): 2 g via INTRAVENOUS

## 2024-05-15 MED ORDER — CHLORHEXIDINE GLUCONATE 0.12 % MT SOLN
OROMUCOSAL | Status: AC
  Administered 2024-05-15: 15 mL via OROMUCOSAL
  Filled 2024-05-15: qty 15

## 2024-05-15 MED ORDER — SODIUM CHLORIDE 0.9 % IV SOLN
0.1500 ug/kg/min | INTRAVENOUS | Status: AC
  Administered 2024-05-15: 12.72 ug/min via INTRAVENOUS
  Filled 2024-05-15 (×2): qty 2000

## 2024-05-15 MED ORDER — POLYETHYLENE GLYCOL 3350 17 G PO PACK: 17.0000 g | PACK | Freq: Every day | ORAL | Status: AC | PRN

## 2024-05-15 MED ORDER — INSULIN ASPART 100 UNIT/ML IJ SOLN: 0.0000 [IU] | INTRAMUSCULAR | Status: DC | PRN

## 2024-05-15 MED ORDER — CALCIUM CARBONATE ANTACID 500 MG PO CHEW
400.0000 mg | CHEWABLE_TABLET | Freq: Three times a day (TID) | ORAL | Status: AC
  Administered 2024-05-15: 400 mg via ORAL
  Filled 2024-05-15: qty 2

## 2024-05-15 MED ORDER — ONDANSETRON HCL 4 MG/2ML IJ SOLN
INTRAMUSCULAR | Status: DC | PRN
  Administered 2024-05-15: 4 mg via INTRAVENOUS

## 2024-05-15 MED ORDER — PROPOFOL 10 MG/ML IV BOLUS
INTRAVENOUS | Status: DC | PRN
  Administered 2024-05-15: 100 mg via INTRAVENOUS
  Administered 2024-05-15: 10 mg via INTRAVENOUS
  Administered 2024-05-15: 200 mg via INTRAVENOUS

## 2024-05-15 MED ORDER — ENOXAPARIN SODIUM 40 MG/0.4ML IJ SOSY: 40.0000 mg | PREFILLED_SYRINGE | INTRAMUSCULAR | Status: AC

## 2024-05-15 MED ORDER — ONDANSETRON HCL 4 MG PO TABS: 4.0000 mg | ORAL_TABLET | ORAL | Status: AC | PRN

## 2024-05-15 MED ORDER — PROPOFOL 10 MG/ML IV BOLUS
INTRAVENOUS | Status: AC
  Filled 2024-05-15: qty 20

## 2024-05-15 MED ORDER — FENTANYL CITRATE (PF) 250 MCG/5ML IJ SOLN
INTRAMUSCULAR | Status: DC | PRN
  Administered 2024-05-15: 100 ug via INTRAVENOUS
  Administered 2024-05-15 (×3): 50 ug via INTRAVENOUS

## 2024-05-15 MED ORDER — SENNA 8.6 MG PO TABS
1.0000 | ORAL_TABLET | Freq: Two times a day (BID) | ORAL | Status: AC
  Filled 2024-05-15: qty 1

## 2024-05-15 MED ORDER — LIDOCAINE 2% (20 MG/ML) 5 ML SYRINGE
INTRAMUSCULAR | Status: DC | PRN
  Administered 2024-05-15: 80 mg via INTRAVENOUS

## 2024-05-15 MED ORDER — MIDAZOLAM HCL 2 MG/2ML IJ SOLN
INTRAMUSCULAR | Status: AC
  Filled 2024-05-15: qty 2

## 2024-05-15 MED ORDER — SODIUM CHLORIDE 0.9 % IV SOLN: INTRAVENOUS | Status: DC | PRN

## 2024-05-15 MED ORDER — ACETAMINOPHEN 160 MG/5ML PO SOLN
650.0000 mg | ORAL | Status: AC
  Administered 2024-05-15: 650 mg via ORAL
  Filled 2024-05-15: qty 20.3

## 2024-05-15 MED ORDER — PROPOFOL 500 MG/50ML IV EMUL
INTRAVENOUS | Status: DC | PRN
  Administered 2024-05-15: 115 ug/kg/min via INTRAVENOUS
  Administered 2024-05-15: 150 ug/kg/min via INTRAVENOUS
  Administered 2024-05-15: 90 ug/kg/min via INTRAVENOUS

## 2024-05-15 MED ORDER — DEXAMETHASONE SOD PHOSPHATE PF 10 MG/ML IJ SOLN
INTRAMUSCULAR | Status: DC | PRN
  Administered 2024-05-15: 10 mg via INTRAVENOUS

## 2024-05-15 MED ORDER — HYDROMORPHONE HCL 1 MG/ML IJ SOLN
0.2500 mg | INTRAMUSCULAR | Status: DC | PRN
  Administered 2024-05-15 (×2): 0.5 mg via INTRAVENOUS

## 2024-05-15 MED ORDER — PHENYLEPHRINE HCL-NACL 20-0.9 MG/250ML-% IV SOLN
INTRAVENOUS | Status: DC | PRN
  Administered 2024-05-15: 30 ug/min via INTRAVENOUS

## 2024-05-15 MED ORDER — SODIUM CHLORIDE 0.9 % IV SOLN
0.1500 ug/kg/min | INTRAVENOUS | Status: AC
  Administered 2024-05-15: .15 ug/kg/min via INTRAVENOUS
  Administered 2024-05-15 (×2): .3 ug/kg/min via INTRAVENOUS
  Filled 2024-05-15: qty 2000

## 2024-05-15 MED ORDER — CHLORHEXIDINE GLUCONATE 0.12 % MT SOLN: 15.0000 mL | Freq: Once | OROMUCOSAL | Status: AC

## 2024-05-15 MED ORDER — CEFAZOLIN SODIUM 1 G IJ SOLR
INTRAMUSCULAR | Status: AC
  Filled 2024-05-15: qty 20

## 2024-05-15 MED ORDER — INSULIN ASPART 100 UNIT/ML IJ SOLN: 0.0000 [IU] | Freq: Three times a day (TID) | INTRAMUSCULAR | Status: AC

## 2024-05-15 MED ORDER — ACETAMINOPHEN 10 MG/ML IV SOLN
INTRAVENOUS | Status: DC | PRN
  Administered 2024-05-15: 1000 mg via INTRAVENOUS

## 2024-05-15 MED ORDER — ACETAMINOPHEN 650 MG RE SUPP: 650.0000 mg | RECTAL | Status: AC

## 2024-05-15 MED ORDER — ORAL CARE MOUTH RINSE: 15.0000 mL | Freq: Once | OROMUCOSAL | Status: AC

## 2024-05-15 MED ORDER — HYDROMORPHONE HCL 1 MG/ML IJ SOLN
INTRAMUSCULAR | Status: DC | PRN
  Administered 2024-05-15: .5 mg via INTRAVENOUS

## 2024-05-15 MED ORDER — LACTATED RINGERS IV SOLN: INTRAVENOUS | Status: DC

## 2024-05-15 MED ORDER — ONDANSETRON HCL 4 MG/2ML IJ SOLN: 4.0000 mg | INTRAMUSCULAR | Status: AC | PRN

## 2024-05-15 MED ORDER — SUCCINYLCHOLINE CHLORIDE 200 MG/10ML IV SOSY
PREFILLED_SYRINGE | INTRAVENOUS | Status: DC | PRN
  Administered 2024-05-15: 140 mg via INTRAVENOUS

## 2024-05-15 MED ORDER — CALCITRIOL 0.5 MCG PO CAPS
0.5000 ug | ORAL_CAPSULE | Freq: Two times a day (BID) | ORAL | Status: AC
  Administered 2024-05-15: 0.5 ug via ORAL
  Filled 2024-05-15: qty 1

## 2024-05-15 MED ORDER — LEVOTHYROXINE SODIUM 25 MCG PO TABS: 125.0000 ug | ORAL_TABLET | Freq: Every day | ORAL | Status: AC

## 2024-05-15 MED ORDER — HYDROMORPHONE HCL 1 MG/ML IJ SOLN
INTRAMUSCULAR | Status: AC
  Filled 2024-05-15: qty 1

## 2024-05-15 MED ORDER — LIDOCAINE-EPINEPHRINE 1 %-1:100000 IJ SOLN
INTRAMUSCULAR | Status: DC | PRN
  Administered 2024-05-15: 7 mL

## 2024-05-15 MED ORDER — FENTANYL CITRATE (PF) 250 MCG/5ML IJ SOLN
INTRAMUSCULAR | Status: AC
  Filled 2024-05-15: qty 5

## 2024-05-15 MED ORDER — HYDROMORPHONE HCL 1 MG/ML IJ SOLN
INTRAMUSCULAR | Status: AC
  Filled 2024-05-15: qty 0.5

## 2024-05-15 MED ORDER — REMIFENTANIL HCL 2 MG IV SOLR
INTRAVENOUS | Status: AC
  Filled 2024-05-15: qty 2000

## 2024-05-15 NOTE — Anesthesia Postprocedure Evaluation (Signed)
"   Anesthesia Post Note  Patient: Ellen Mills  Procedure(s) Performed: TOTAL THYROIDECTOMY (Bilateral)     Patient location during evaluation: PACU Anesthesia Type: General Level of consciousness: awake and alert Pain management: pain level controlled Vital Signs Assessment: post-procedure vital signs reviewed and stable Respiratory status: spontaneous breathing, nonlabored ventilation, respiratory function stable and patient connected to nasal cannula oxygen Cardiovascular status: blood pressure returned to baseline and stable Postop Assessment: no apparent nausea or vomiting Anesthetic complications: no   No notable events documented.  Last Vitals:  Vitals:   05/15/24 1930 05/15/24 2019  BP: 130/85 123/82  Pulse: (!) 103 (!) 106  Resp: (!) 21 14  Temp: 37.1 C 36.8 C  SpO2: 93% 95%    Last Pain:  Vitals:   05/15/24 1847  TempSrc:   PainSc: 8    Pain Goal:                   Makail Watling L Aarion Metzgar      "

## 2024-05-15 NOTE — H&P (Signed)
 Pre-Operative H&P - Day Of Surgery Patient Name: Ellen Mills Date:   05/15/2024  HPI: Ellen Mills is a 54 y.o. female who presents today for operative treatment of multinodular goiter, dysphagia. Patient denies recent significant changes to health or significant new medications or physiologic change in condition which would immediately impact plans. No new types of therapy has been initiated that would change the plan or the appropriateness of the plan.   ROS:  A complete review of systems was obtained and is otherwise negative.   PMH:  Past Medical History:  Diagnosis Date   Anemia    Diabetes (HCC) 07/2017   Hyperlipidemia    Multinodular goiter    Obesity     PSH:  Past Surgical History:  Procedure Laterality Date   ABDOMINAL HYSTERECTOMY     LEEP     WISDOM TOOTH EXTRACTION      MEDS:  Current Medications[1]  ALLERGIES: Patient has no known allergies.  EXAM: Vitals: BP 113/82   Pulse 82   Temp 98.3 F (36.8 C) (Oral)   Resp 18   Ht 5' 6 (1.676 m)   Wt 84.8 kg   SpO2 98%   BMI 30.18 kg/m   General Awake, at baseline alertness.   HEENT No scleral icterus or conjunctival hemorrhage. Globe position appears normal. External ears  normal. Nose patent without rhinorrhea. No lymphadenopathy. No thyromegaly  Cardiovascular No cyanosis.  Pulmonary No audible stridor. Breathing easily with no labor.  Neuro Symmetric facial movement.   Psychiatry Appropriate affect and mood.  Skin No scars or lesions on face or neck.  Extermities Moves all extremities with normal range of motion.   Other Findings None.   Assessment & Plan: Ellen Mills has diagnoses of multinodular goiter, dysphagia and will go to the OR today for total thyroidectomy. Informed consent was obtained and available in EMR today. All questions have been answered, and risks/benefits/alternatives of procedure as noted in the consent were discussed in a quiet area. Questions were invited and answered. The patient  expressed understanding, provided consent and wished to proceed despite risks.  We discussed that we can also just observe or start with hemithyroidectomy for her compressive symptoms. She, however, would like to proceed with total thyroidectomy.   Risks of thyroidectomy were discussed including pain, bleeding, infection, need for drain placement, injury to recurrent laryngeal nerve including vocal fold paralysis and hoarseness, lack of improvement, need for further procedures, change in swallowing, hypocalcemia, injury to trachea or surrounding great vessels including pulmonary complications, and discovery of malignancy. Patient understands these risks, and despite these risks, wishes to proceed.   I also reported that we could start with just a left thyroid  lobectomy if her main complaint is compressive symptoms but she again reiterates that she'd like to have a total thyroidectomy despite risk of hypocalcemia and RLN injury risk and lack of improvement in symptoms.   Given her sx, we will start on left side first. If nerve stops working intraoperatively, I would stop the procedure to assess the nerve. She understands this.  Ellen Mills 05/15/2024 12:05 PM     [1]  Current Facility-Administered Medications:    insulin  aspart (novoLOG ) injection 0-14 Units, 0-14 Units, Subcutaneous, Q2H PRN, Leopoldo Bruckner, MD   lactated ringers  infusion, , Intravenous, Continuous, Colhoun, Lauraine DASEN, MD, Last Rate: 10 mL/hr at 05/15/24 1117, Continued from Pre-op at 05/15/24 1117   remifentanil  (ULTIVA ) 2 mg in 100 mL normal saline (20 mcg/mL) Optime, 0.15 mcg/kg/min (Ideal), Intravenous, To  OR, Jerl Donald LABOR, CRNA

## 2024-05-15 NOTE — Anesthesia Preprocedure Evaluation (Signed)
Anesthesia Evaluation  ° ° °Airway ° ° ° ° ° ° ° Dental °  °Pulmonary ° °  ° ° ° ° ° ° ° Cardiovascular ° ° ° °  °Neuro/Psych °  ° GI/Hepatic °  °Endo/Other  °diabetes ° Renal/GU °  ° °  °Musculoskeletal ° ° Abdominal °  °Peds ° Hematology °  °Anesthesia Other Findings ° ° Reproductive/Obstetrics ° °  ° ° ° ° ° ° ° ° ° ° ° ° ° °  °  ° ° ° ° ° ° ° ° °Anesthesia Physical °Anesthesia Plan °Anesthesia Quick Evaluation ° °

## 2024-05-15 NOTE — Op Note (Signed)
 Otolaryngology Operative note  Vicy Medico Date/Time of Admission: 05/15/2024  9:37 AM  CSN: [id number];MRN:[id number]  DOB: Dec 02, 1970 Age: 54 y.o. Location: [facility] OR    Pre-Op Diagnosis: Multiple thyroid  nodules Dysphagia  Post-Op Diagnosis: Same  Procedure: TOTAL THYROIDECTOMY with Nerve monitoring (CPT 60240)  Surgeon: Eldora Blanch, MD  Anesthesia type:  General  Anesthesiologist: Anesthesiologist: Paul Lamarr BRAVO, MD; Leopoldo Bruckner, MD; Niels Marien CROME, MD CRNA: Vertie Russell NOVAK, CRNA; Jerl Donald LABOR, CRNA; Erlene Powell POUR, CRNA   Staff: Circulator: Prentiss Shona NOVAK, RN Relief Circulator: Bridgett Waddell PARAS, RN; Prentiss Shona NOVAK, RN Relief Scrub: Orvil Asberry POUR Scrub Person: Isaiah Flaming; Primus Leita HERO, RN; Kassie Charliene DASEN, RN RN First Assistant: Susen Burnard LABOR, RN  Specimens: ID Type Source Tests Collected by Time Destination  1 : Total Thyroid . short stitch right superior pole, long stitch left superior pole Tissue PATH ENT excision SURGICAL PATHOLOGY Blanch Eldora NOVAK, MD 05/15/2024 1407     EBL:  100 mL  Drains: 15 Fr suction Drain  Post-op disposition and condition: PACU, hemodynamically stable  Findings:  Large multinodular goiter; excised Recurrent laryngeal nerve was identified, noted visually intact and noted to be stimulating at 0.78mA bilaterally at end of case  Complications: None apparent  Indications and consent:  Ellen Mills is a 54 y.o. female with diagnoses above with compressive symptoms (primarily dysphagia) and multinodular goiter The patient's options were discussed, including risks/benefits/alternatives for each option. Patient expressed understanding, and despite these risks, consented and decided to proceed with above procedures. Informed consent was signed before proceeding.  Procedure: After being properly identified in the preoperative holding area, the patient was brought into the operating suite.  Patient was  placed supine on the operating table.  A pre-procedural time-out was performed. Pre-operative antibiotics and steroids were administered. After induction of general anesthesia, the patient was successfully intubated with a Nerve Integrity Monitoring endotracheal tube confirmed with CO2 return. Proper tube placement was confirmed with glidescope. The patient was prepped and draped in the usual fashion for this procedure.   Using a marking pen, a line was drawn horizontally in a natural neck crease. The skin and subcutaneous tissues were sharply dissected to the level of the platysma. The platysma itself was then incised and sub-platysmal flaps were elevated. The midline raphe between the strap muscles was identified. This was divided from the thyroid  cartilage to near the sternal notch. The strap muscles were retracted laterally exposing the left thyroid  lobe. The thyroid  lobe was retracted medially and the strap muscles overlying this lobe were carefully dissected free. The superior pole was identified and with the thyroid  retracted inferiorly, the vessels were progressively ligated with 2-0 silk suture and hemo-clips. Care was taken to stay very near the gland to prevent injury to the superior laryngeal nerve. With the superior pole freed, attachments along the mid thyroid  were released and the lobe was retracted medially. Working lateral to the cricothyroid joint, the recurrent laryngeal nerve was identified. The nerve was stimulated and was noted to stimulate at 0.47mA. The nerve was then dissected along its course toward the cricothyroid membrane. Soft tissue attachments were lifted off the nerve further freeing the gland and allowing it to roll medially. The inferior pole was then controlled by identifying, and ligating inferior pole vessels using 2-0 silk ties and hemo-clips. Superior and inferior parathyroid gland candidates were identified and preserved.   We then proceeded to the right side. The thyroid   lobe was retracted medially and the  strap muscles overlying this lobe were carefully dissected free. The superior pole was identified and with the thyroid  retracted inferiorly, the vessels were progressively ligated with 2-0 silk suture and hemo-clips. Care was taken to stay very near the gland to prevent injury to the superior laryngeal nerve. With the superior pole freed, attachments along the mid thyroid  were released and the lobe was retracted medially. Working lateral to the cricothyroid joint, the recurrent laryngeal nerve was identified. The nerve was stimulated and was noted stimulate at 0.34mA. The nerve was then dissected along its course toward the cricothyroid membrane. Soft tissue attachments were lifted off the nerve further freeing the gland and allowing it to roll medially. The inferior pole was then controlled by identifying, and ligating inferior pole vessels using 2-0 silk ties and hemo-clips. Superior and inferior parathyroid gland candidates were identified and preserved.  Finally, the entire gland was lifted off the trachea and berry's ligament and the isthmus was lifted off the trachea using bipolar cautery. The specimen was passed off the field as a permanent specimen and was oriented  Bipolar cautery was used for hemostasis. Hemostasis was then obtained, confirmed with valsalva, and the wound bed was copiously irrigated with saline. Both recurrent laryngeal nerves were stimulated at 0.38mA and were noted to be stimulating at end of procedure. Additional hemostatic agent (Surgicel SNoW) was used. A 15 Fr round suction drain was inserted, brought through the skin and secured with a 2-0 nylon suture. 3-0 vicryl interrupted sutures were use to close the strap muscles. 3-0 vicryl interrupted sutures were used to close the platysma and inverted 4-0 vicryl sutures for the deep dermis in an interrupted fashion. A 5-0 monocryl suture was used to approximate the skin in a running subcuticular  fashion.  With the surgical portion of the procedure complete, all instrumentation was then removed from the operative field. The patient's skin was cleaned.  Patient was returned to the care of the anesthesia team.  Patient was then weaned from the anesthetic and transported to the PACU in stable condition.

## 2024-05-15 NOTE — Transfer of Care (Signed)
 Immediate Anesthesia Transfer of Care Note  Patient: Ellen Mills  Procedure(s) Performed: TOTAL THYROIDECTOMY (Bilateral)  Patient Location: PACU  Anesthesia Type:General  Level of Consciousness: awake and patient cooperative  Airway & Oxygen Therapy: Patient Spontanous Breathing and Patient connected to face mask oxygen  Post-op Assessment: Report given to RN and Post -op Vital signs reviewed and stable  Post vital signs: Reviewed and stable  Last Vitals:  Vitals Value Taken Time  BP 123/87 05/15/24 18:04  Temp    Pulse 108 05/15/24 18:07  Resp 24 05/15/24 18:07  SpO2 100 % 05/15/24 18:07  Vitals shown include unfiled device data.  Last Pain:  Vitals:   05/15/24 1011  TempSrc:   PainSc: 0-No pain         Complications: No notable events documented.

## 2024-05-15 NOTE — Anesthesia Procedure Notes (Signed)
 Procedure Name: Intubation Date/Time: 05/15/2024 12:34 PM  Performed by: Jerl Donald LABOR, CRNAPre-anesthesia Checklist: Patient identified, Emergency Drugs available, Suction available and Patient being monitored Patient Re-evaluated:Patient Re-evaluated prior to induction Oxygen Delivery Method: Circle System Utilized Preoxygenation: Pre-oxygenation with 100% oxygen Induction Type: IV induction Ventilation: Mask ventilation without difficulty Laryngoscope Size: Glidescope and 3 Grade View: Grade I Tube type: Oral (NIM) Tube size: 7.0 mm Number of attempts: 1 Airway Equipment and Method: Video-laryngoscopy and Rigid stylet Placement Confirmation: ETT inserted through vocal cords under direct vision, positive ETCO2 and breath sounds checked- equal and bilateral Secured at: 23 cm Tube secured with: Tape Dental Injury: Teeth and Oropharynx as per pre-operative assessment

## 2024-05-20 ENCOUNTER — Encounter (INDEPENDENT_AMBULATORY_CARE_PROVIDER_SITE_OTHER): Admitting: Otolaryngology

## 2024-06-08 ENCOUNTER — Ambulatory Visit: Admitting: Family Medicine
